# Patient Record
Sex: Female | Born: 1945 | Race: White | Hispanic: No | Marital: Married | Smoking: Current every day smoker
Health system: Southern US, Community
[De-identification: ages and names within clinical notes are randomized; demographics above are authoritative.]

## PROBLEM LIST (undated history)

## (undated) DIAGNOSIS — Z923 Personal history of irradiation: Secondary | ICD-10-CM

## (undated) DIAGNOSIS — E7800 Pure hypercholesterolemia, unspecified: Secondary | ICD-10-CM

## (undated) DIAGNOSIS — F32A Depression, unspecified: Secondary | ICD-10-CM

## (undated) DIAGNOSIS — I8392 Asymptomatic varicose veins of left lower extremity: Secondary | ICD-10-CM

## (undated) DIAGNOSIS — R011 Cardiac murmur, unspecified: Secondary | ICD-10-CM

## (undated) DIAGNOSIS — R413 Other amnesia: Secondary | ICD-10-CM

## (undated) DIAGNOSIS — R7989 Other specified abnormal findings of blood chemistry: Secondary | ICD-10-CM

## (undated) DIAGNOSIS — D0510 Intraductal carcinoma in situ of unspecified breast: Secondary | ICD-10-CM

## (undated) DIAGNOSIS — I10 Essential (primary) hypertension: Secondary | ICD-10-CM

## (undated) DIAGNOSIS — Z8739 Personal history of other diseases of the musculoskeletal system and connective tissue: Secondary | ICD-10-CM

## (undated) DIAGNOSIS — M51369 Other intervertebral disc degeneration, lumbar region without mention of lumbar back pain or lower extremity pain: Secondary | ICD-10-CM

## (undated) DIAGNOSIS — I208 Other forms of angina pectoris: Secondary | ICD-10-CM

## (undated) DIAGNOSIS — C801 Malignant (primary) neoplasm, unspecified: Secondary | ICD-10-CM

## (undated) DIAGNOSIS — M1990 Unspecified osteoarthritis, unspecified site: Secondary | ICD-10-CM

## (undated) DIAGNOSIS — M5136 Other intervertebral disc degeneration, lumbar region: Secondary | ICD-10-CM

## (undated) DIAGNOSIS — I2089 Other forms of angina pectoris: Secondary | ICD-10-CM

## (undated) HISTORY — PX: HERNIA REPAIR: SHX51

## (undated) HISTORY — PX: HEMORRHOIDECTOMY WITH HEMORRHOID BANDING: SHX5633

## (undated) HISTORY — DX: Cardiac murmur, unspecified: R01.1

## (undated) HISTORY — DX: Malignant (primary) neoplasm, unspecified: C80.1

## (undated) HISTORY — DX: Other intervertebral disc degeneration, lumbar region: M51.36

## (undated) HISTORY — PX: APPENDECTOMY: SHX54

## (undated) HISTORY — DX: Other intervertebral disc degeneration, lumbar region without mention of lumbar back pain or lower extremity pain: M51.369

## (undated) HISTORY — DX: Other forms of angina pectoris: I20.8

## (undated) HISTORY — DX: Other forms of angina pectoris: I20.89

## (undated) HISTORY — DX: Essential (primary) hypertension: I10

## (undated) HISTORY — DX: Unspecified osteoarthritis, unspecified site: M19.90

---

## 1975-12-13 DIAGNOSIS — C801 Malignant (primary) neoplasm, unspecified: Secondary | ICD-10-CM

## 1975-12-13 HISTORY — DX: Malignant (primary) neoplasm, unspecified: C80.1

## 1975-12-13 HISTORY — PX: ABDOMINAL HYSTERECTOMY: SHX81

## 2000-12-12 HISTORY — PX: COLONOSCOPY: SHX174

## 2005-02-17 ENCOUNTER — Ambulatory Visit: Payer: Self-pay

## 2006-02-09 ENCOUNTER — Ambulatory Visit: Payer: Self-pay | Admitting: Internal Medicine

## 2007-02-10 ENCOUNTER — Ambulatory Visit: Payer: Self-pay | Admitting: Internal Medicine

## 2007-06-25 ENCOUNTER — Ambulatory Visit: Payer: Self-pay | Admitting: Physician Assistant

## 2007-10-22 ENCOUNTER — Ambulatory Visit: Payer: Self-pay | Admitting: Gastroenterology

## 2007-10-29 ENCOUNTER — Ambulatory Visit: Payer: Self-pay | Admitting: Gastroenterology

## 2007-12-19 ENCOUNTER — Ambulatory Visit: Payer: Self-pay | Admitting: General Surgery

## 2008-12-12 DIAGNOSIS — M1990 Unspecified osteoarthritis, unspecified site: Secondary | ICD-10-CM

## 2008-12-12 HISTORY — DX: Unspecified osteoarthritis, unspecified site: M19.90

## 2012-12-12 DIAGNOSIS — I10 Essential (primary) hypertension: Secondary | ICD-10-CM

## 2012-12-12 HISTORY — PX: BREAST BIOPSY: SHX20

## 2012-12-12 HISTORY — DX: Essential (primary) hypertension: I10

## 2013-01-31 ENCOUNTER — Ambulatory Visit: Payer: Self-pay | Admitting: Family

## 2013-02-25 ENCOUNTER — Ambulatory Visit: Payer: Self-pay | Admitting: General Surgery

## 2013-02-26 ENCOUNTER — Telehealth: Payer: Self-pay | Admitting: General Surgery

## 2013-02-26 LAB — PATHOLOGY REPORT

## 2013-02-26 NOTE — Telephone Encounter (Signed)
The patient breast biopsy was benign. The pathologist reported microcalcifications of fibrocystic changes. The patient reports that she has had minimal discomfort well controlled with Tylenol. The patient is aware that she needs to present next week for wound evaluation with the nurse and will be contacted in 6 months for repeat right breast mammogram.

## 2013-02-28 ENCOUNTER — Encounter: Payer: Self-pay | Admitting: *Deleted

## 2013-03-04 ENCOUNTER — Ambulatory Visit (INDEPENDENT_AMBULATORY_CARE_PROVIDER_SITE_OTHER): Payer: Medicare Other | Admitting: *Deleted

## 2013-03-04 DIAGNOSIS — R920 Mammographic microcalcification found on diagnostic imaging of breast: Secondary | ICD-10-CM

## 2013-03-04 NOTE — Progress Notes (Signed)
March 04, 2013 @ 1025 This pt is seen this morning for wound check, one week follow up right breast stereo biopsy. Pt has no complaints. Right breast biopsy site with steri strips intacy. No bruising, redness,swelling or drainage noted. Pt is to follow up in 6 months with a right breast mammogram and office visit to follow. Pt advised that she would receive a letter from our office at that time with date/time of both appointments. Pt advised to call our office if any problems before that time. Pt verbalized her understanding. L.Alonna Buckler RN

## 2013-03-26 ENCOUNTER — Ambulatory Visit: Payer: Self-pay | Admitting: Family

## 2013-08-29 ENCOUNTER — Ambulatory Visit: Payer: Self-pay | Admitting: General Surgery

## 2013-09-02 ENCOUNTER — Encounter: Payer: Self-pay | Admitting: General Surgery

## 2013-09-10 ENCOUNTER — Ambulatory Visit: Payer: Medicare Other | Admitting: General Surgery

## 2013-10-09 ENCOUNTER — Encounter: Payer: Self-pay | Admitting: *Deleted

## 2014-10-13 ENCOUNTER — Encounter: Payer: Self-pay | Admitting: *Deleted

## 2015-02-12 DIAGNOSIS — E784 Other hyperlipidemia: Secondary | ICD-10-CM | POA: Diagnosis not present

## 2015-02-12 DIAGNOSIS — I10 Essential (primary) hypertension: Secondary | ICD-10-CM | POA: Diagnosis not present

## 2015-02-12 DIAGNOSIS — R5381 Other malaise: Secondary | ICD-10-CM | POA: Diagnosis not present

## 2015-04-03 NOTE — Op Note (Signed)
PATIENT NAME:  Deanna Grant, Deanna Grant MR#:  [id number] DATE OF BIRTH:  [birth year]  DATE OF PROCEDURE:  02/25/2013  PREOPERATIVE DIAGNOSIS: Right inguinal hernia.   POSTOPERATIVE DIAGNOSIS: Right inguinal hernia.   OPERATIVE PROCEDURE: Right inguinal hernia repair with Ultrapro mesh.   SURGEON: Hervey Ard, MD  ANESTHESIA: General by LMA under Dr. Carolin Sicks, Marcaine 0.5% with 1:200,000 units of epinephrine, 30 mL local infiltration, Toradol 30 mg.   ESTIMATED BLOOD LOSS: Minimal.   CLINICAL NOTE: This 68 year old woman has developed a symptomatic right inguinal hernia. She was admitted for elective repair.   DESCRIPTION OF PROCEDURE: With the patient under adequate general anesthesia, the lower abdomen and proximal thigh were prepped with ChloraPrep and draped. A 5 cm skin line incision was made after infiltration with local anesthetic for postoperative analgesia. Skin and subcutaneous tissue was divided sharply. Hemostasis was with 3-0 Vicryl ties and electrocautery. The external oblique was opened in the direction of its fibers. There was a large inguinal hernia defect. The round ligament was excised and discarded. The preperitoneal space was cleared. The femoral canal was cleared. A large Ultrapro mesh was smoothed into position. The external component was laid along the floor of the inguinal canal. This was anchored to the pubic tubercle with 0 Surgilon. The inferior border was anchored to the inguinal ligament with interrupted 0 Surgilon sutures and the medial and superior borders anchored to the transverse abdominis aponeurosis. The ilioinguinal and iliohypogastric nerves were identified and protected. The external oblique was closed with a running 2-0 Vicryl after instillation of 30 mg of Toradol. Scarpa's fascia was closed with a running 3-0 Vicryl and the skin closed with a        running 4-0 Vicryl subcuticular suture. Benzoin, Steri-Strips, Telfa and Tegaderm dressing was then applied.    The patient tolerated the procedure well and was taken to the recovery room in stable condition.  ____________________________ Robert Bellow, MD jwb:sb D: 02/25/2013 10:54:33 ET T: 02/25/2013 11:28:17 ET JOB#: [id number]  cc: Robert Bellow, MD, <Dictator> Gracy Bruins. Jackelyn Hoehn, NP

## 2015-06-26 DIAGNOSIS — G4762 Sleep related leg cramps: Secondary | ICD-10-CM | POA: Diagnosis not present

## 2015-06-26 DIAGNOSIS — Z87891 Personal history of nicotine dependence: Secondary | ICD-10-CM | POA: Diagnosis not present

## 2015-06-26 DIAGNOSIS — I10 Essential (primary) hypertension: Secondary | ICD-10-CM | POA: Diagnosis not present

## 2016-09-13 DIAGNOSIS — E8881 Metabolic syndrome: Secondary | ICD-10-CM | POA: Diagnosis not present

## 2016-09-13 DIAGNOSIS — E784 Other hyperlipidemia: Secondary | ICD-10-CM | POA: Diagnosis not present

## 2016-09-13 DIAGNOSIS — R5381 Other malaise: Secondary | ICD-10-CM | POA: Diagnosis not present

## 2016-09-13 DIAGNOSIS — G4762 Sleep related leg cramps: Secondary | ICD-10-CM | POA: Diagnosis not present

## 2016-09-13 DIAGNOSIS — I10 Essential (primary) hypertension: Secondary | ICD-10-CM | POA: Diagnosis not present

## 2016-09-21 DIAGNOSIS — E8881 Metabolic syndrome: Secondary | ICD-10-CM | POA: Diagnosis not present

## 2016-09-21 DIAGNOSIS — E784 Other hyperlipidemia: Secondary | ICD-10-CM | POA: Diagnosis not present

## 2016-09-21 DIAGNOSIS — Z87891 Personal history of nicotine dependence: Secondary | ICD-10-CM | POA: Diagnosis not present

## 2016-09-21 DIAGNOSIS — T360X5S Adverse effect of penicillins, sequela: Secondary | ICD-10-CM | POA: Diagnosis not present

## 2017-05-12 DIAGNOSIS — I10 Essential (primary) hypertension: Secondary | ICD-10-CM | POA: Diagnosis not present

## 2017-05-12 DIAGNOSIS — R5381 Other malaise: Secondary | ICD-10-CM | POA: Diagnosis not present

## 2017-05-12 DIAGNOSIS — R6889 Other general symptoms and signs: Secondary | ICD-10-CM | POA: Diagnosis not present

## 2017-05-12 DIAGNOSIS — Z1231 Encounter for screening mammogram for malignant neoplasm of breast: Secondary | ICD-10-CM | POA: Diagnosis not present

## 2017-05-12 DIAGNOSIS — Z1211 Encounter for screening for malignant neoplasm of colon: Secondary | ICD-10-CM | POA: Diagnosis not present

## 2017-05-12 DIAGNOSIS — M25512 Pain in left shoulder: Secondary | ICD-10-CM | POA: Diagnosis not present

## 2017-05-12 DIAGNOSIS — Z720 Tobacco use: Secondary | ICD-10-CM | POA: Diagnosis not present

## 2017-05-12 DIAGNOSIS — R5383 Other fatigue: Secondary | ICD-10-CM | POA: Diagnosis not present

## 2017-05-12 DIAGNOSIS — R7989 Other specified abnormal findings of blood chemistry: Secondary | ICD-10-CM | POA: Insufficient documentation

## 2017-05-12 DIAGNOSIS — M542 Cervicalgia: Secondary | ICD-10-CM | POA: Diagnosis not present

## 2017-05-12 DIAGNOSIS — G8929 Other chronic pain: Secondary | ICD-10-CM | POA: Diagnosis not present

## 2017-05-12 DIAGNOSIS — Z7689 Persons encountering health services in other specified circumstances: Secondary | ICD-10-CM | POA: Diagnosis not present

## 2017-07-07 DIAGNOSIS — K5909 Other constipation: Secondary | ICD-10-CM | POA: Diagnosis not present

## 2017-07-07 DIAGNOSIS — K219 Gastro-esophageal reflux disease without esophagitis: Secondary | ICD-10-CM | POA: Diagnosis not present

## 2017-07-07 DIAGNOSIS — Z1211 Encounter for screening for malignant neoplasm of colon: Secondary | ICD-10-CM | POA: Diagnosis not present

## 2017-09-29 ENCOUNTER — Emergency Department
Admission: EM | Admit: 2017-09-29 | Discharge: 2017-09-29 | Disposition: A | Discharge status: Home / Self Care | Payer: Medicare Other | Attending: Emergency Medicine | Admitting: Emergency Medicine

## 2017-09-29 ENCOUNTER — Emergency Department: Payer: Medicare Other

## 2017-09-29 ENCOUNTER — Encounter: Payer: Self-pay | Admitting: Emergency Medicine

## 2017-09-29 DIAGNOSIS — M5116 Intervertebral disc disorders with radiculopathy, lumbar region: Secondary | ICD-10-CM | POA: Diagnosis not present

## 2017-09-29 DIAGNOSIS — Y939 Activity, unspecified: Secondary | ICD-10-CM | POA: Diagnosis not present

## 2017-09-29 DIAGNOSIS — Y999 Unspecified external cause status: Secondary | ICD-10-CM | POA: Insufficient documentation

## 2017-09-29 DIAGNOSIS — F17200 Nicotine dependence, unspecified, uncomplicated: Secondary | ICD-10-CM | POA: Diagnosis not present

## 2017-09-29 DIAGNOSIS — Z79899 Other long term (current) drug therapy: Secondary | ICD-10-CM | POA: Insufficient documentation

## 2017-09-29 DIAGNOSIS — M5136 Other intervertebral disc degeneration, lumbar region: Secondary | ICD-10-CM

## 2017-09-29 DIAGNOSIS — I10 Essential (primary) hypertension: Secondary | ICD-10-CM | POA: Diagnosis not present

## 2017-09-29 DIAGNOSIS — X58XXXA Exposure to other specified factors, initial encounter: Secondary | ICD-10-CM | POA: Diagnosis not present

## 2017-09-29 DIAGNOSIS — S39012A Strain of muscle, fascia and tendon of lower back, initial encounter: Secondary | ICD-10-CM | POA: Insufficient documentation

## 2017-09-29 DIAGNOSIS — M62830 Muscle spasm of back: Secondary | ICD-10-CM | POA: Diagnosis not present

## 2017-09-29 DIAGNOSIS — Y929 Unspecified place or not applicable: Secondary | ICD-10-CM | POA: Diagnosis not present

## 2017-09-29 DIAGNOSIS — M545 Low back pain: Secondary | ICD-10-CM | POA: Diagnosis not present

## 2017-09-29 DIAGNOSIS — S3992XA Unspecified injury of lower back, initial encounter: Secondary | ICD-10-CM | POA: Diagnosis present

## 2017-09-29 LAB — URINALYSIS, COMPLETE (UACMP) WITH MICROSCOPIC
Bilirubin Urine: NEGATIVE
GLUCOSE, UA: NEGATIVE mg/dL
Hgb urine dipstick: NEGATIVE
KETONES UR: NEGATIVE mg/dL
LEUKOCYTES UA: NEGATIVE
NITRITE: NEGATIVE
PH: 7 (ref 5.0–8.0)
Protein, ur: NEGATIVE mg/dL
SPECIFIC GRAVITY, URINE: 1.009 (ref 1.005–1.030)

## 2017-09-29 MED ORDER — ORPHENADRINE CITRATE 30 MG/ML IJ SOLN
60.0000 mg | INTRAMUSCULAR | Status: AC
  Administered 2017-09-29: 60 mg via INTRAMUSCULAR
  Filled 2017-09-29: qty 2

## 2017-09-29 MED ORDER — KETOROLAC TROMETHAMINE 30 MG/ML IJ SOLN
30.0000 mg | Freq: Once | INTRAMUSCULAR | Status: AC
  Administered 2017-09-29: 30 mg via INTRAMUSCULAR
  Filled 2017-09-29: qty 1

## 2017-09-29 MED ORDER — DICLOFENAC SODIUM 50 MG PO TBEC: 50.0000 mg | DELAYED_RELEASE_TABLET | Freq: Two times a day (BID) | ORAL | 1 refills | Status: DC

## 2017-09-29 MED ORDER — CYCLOBENZAPRINE HCL 5 MG PO TABS: 5.0000 mg | ORAL_TABLET | Freq: Three times a day (TID) | ORAL | 0 refills | Status: AC | PRN

## 2017-09-29 NOTE — ED Provider Notes (Signed)
[facility] Emergency Department Provider Note ____________________________________________  Time seen: 1405  I have reviewed the triage vital signs and the nursing notes.  HISTORY  Chief Complaint  Back Pain  HPI Deanna Grant is a 71 y.o. female Presents to the ED, and by her adult daughter, for evaluation of intermittent right-sided low back pain for the last 3 weeks. Patient denies any injury, accident, trauma, or fall. She also denies any distal paresthesias, foot drop, or incontinence. She localizes pain to the right flank and reports the pain is intermittent with spasms. She also reports pain is aggravated by transitioning from sit to stand as well as turning over in the bed. He denies any bladder or bowel changes, dysuria, urinary retention, or fevers. She has a history of underlying arthritis, hypertension, and appendectomy.  Past Medical History:  Diagnosis Date  . Angina at rest [facility])   . Arthritis 2010  . Cancer ([place]) 1977   cervical  . Heart murmur   . Hypertension 2014    Patient Active Problem List   Diagnosis Date Noted  . Hypertension     Past Surgical History:  Procedure Laterality Date  . ABDOMINAL HYSTERECTOMY  1977  . BREAST BIOPSY Right 2014   stereo  . COLONOSCOPY  2002    Prior to Admission medications   Medication Sig Start Date End Date Taking? Authorizing Provider  simvastatin (ZOCOR) 20 MG tablet Take 20 mg by mouth daily.   Yes [provider]  ALPRAZolam (XANAX) 0.25 MG tablet Take 0.25 mg by mouth at bedtime as needed for sleep.    [provider]  atorvastatin (LIPITOR) 20 MG tablet Take 20 mg by mouth daily.    [provider]  naproxen (NAPROSYN) 500 MG tablet Take 500 mg by mouth 2 (two) times daily with a meal.    [provider]  omeprazole (PRILOSEC) 20 MG capsule Take 20 mg by mouth daily.    [provider]  triamterene-hydrochlorothiazide (DYAZIDE) 37.5-25 MG per  capsule Take 1 capsule by mouth daily.    [provider]   Allergies Asa [aspirin] and Penicillins  Family History  Problem Relation Age of Onset  . Colon cancer Father   . Pancreatic cancer Brother   . Lung cancer Cousin     Social History Social History  Substance Use Topics  . Smoking status: Current Every Day Smoker  . Smokeless tobacco: Never Used  . Alcohol use No    Review of Systems  Constitutional: Negative for fever. Cardiovascular: Negative for chest pain. Respiratory: Negative for shortness of breath. Gastrointestinal: Negative for abdominal pain, vomiting and diarrhea. Genitourinary: Negative for dysuria. Musculoskeletal: positive for back pain. Skin: Negative for rash. Neurological: Negative for headaches, focal weakness or numbness. ____________________________________________  PHYSICAL EXAM:  VITAL SIGNS: ED Triage Vitals  Enc Vitals Group     BP 09/29/17 1249 (!) 163/82     Pulse Rate 09/29/17 1249 69     Resp 09/29/17 1249 16     Temp 09/29/17 1249 98.1 F (36.7 C)     Temp Source 09/29/17 1249 Oral     SpO2 09/29/17 1249 100 %     Weight 09/29/17 1250 162 lb (73.5 kg)     Height --      Head Circumference --      Peak Flow --      Pain Score 09/29/17 1246 10     Pain Loc --      Pain  Edu? --      Excl. in [place]? --     Constitutional: Alert and oriented. Well appearing and in no distress. Head: Normocephalic and atraumatic. Cardiovascular: Normal rate, regular rhythm. Normal distal pulses. Respiratory: Normal respiratory effort. No wheezes/rales/rhonchi. Gastrointestinal: Soft and nontender. No distention. Musculoskeletal: normal spinal alignment without midline tenderness, spasm, deformity, or step-off. Patient with tenderness palpation over the right flank on palpation. No palpable muscle spasm is noted. Patient transitioned from sitting to standing without difficulty. Nontender with normal range of motion in all extremities.   Neurologic: CN II-XII grossly intact. Normal LE DTRs bilaterally. Normal gait without ataxia. Normal speech and language. No gross focal neurologic deficits are appreciated. Skin:  Skin is warm, dry and intact. No rash noted. ____________________________________________   LABS (pertinent positives/negatives)  Labs Reviewed  URINALYSIS, COMPLETE (UACMP) WITH MICROSCOPIC - Abnormal; Notable for the following:       Result Value   Color, Urine YELLOW (*)    APPearance CLEAR (*)    Bacteria, UA RARE (*)    Squamous Epithelial / LPF 0-5 (*)    All other components within normal limits  ___________________________________________   RADIOLOGY  Lumbar Spine  Findings:  There is grade 1 anterolisthesis of L4 on L5 secondary to facet disease. There is no spondylolysis.  There is no acute fracture.  There is mild degenerative disc disease with disc height loss at L1-2 and L2-3. Bilateral facet arthropathy at L4-5 and L5-S1. ____________________________________________  PROCEDURES  Toradol 30 mg IM Norflex 60 mg IM ____________________________________________  INITIAL IMPRESSION / ASSESSMENT AND PLAN / ED COURSE  Patient with ED evaluation of a 3 weeks of intermittent right-sided low back pain. Patient's exam is overall benign. She has reproducible, palpable pain to the right paraspinal musculature. Her urinalysis is negative for any acute cystitis. Her x-rays for any acute findings. Her symptoms be more consistent with a muscle strain despite her underlying Arthritis and DDD. She is discharged with a prescription for Flexeril and Voltaren to dose as directed. She will follow with primary care provider for ongoing symptom management. Return to ED as needed. ____________________________________________  FINAL CLINICAL IMPRESSION(S) / ED DIAGNOSES  Final diagnoses:  Strain of lumbar region, initial encounter  Muscle spasm of back  DDD (degenerative disc disease), lumbar       Phebe Dettmer, Dannielle Karvonen, PA-C 09/29/17 1603    Schuyler Amor, MD 09/29/17 2112

## 2017-09-29 NOTE — Discharge Instructions (Signed)
Your exam, urine, and x-rays are consistent with a lumbar muscle strain. Take the prescription meds as directed. Follow-up with your provider or ongoing symptom management. Return to the ED as needed.

## 2017-09-29 NOTE — ED Triage Notes (Signed)
Pt to ed with c/o back pain intermittent x 3 weeks.  Pt states much more severe this am.  Reports worse with movement and activity.

## 2017-11-13 ENCOUNTER — Other Ambulatory Visit: Payer: Self-pay | Admitting: Internal Medicine

## 2017-11-13 ENCOUNTER — Encounter: Payer: Self-pay | Admitting: *Deleted

## 2017-11-13 DIAGNOSIS — Z1231 Encounter for screening mammogram for malignant neoplasm of breast: Secondary | ICD-10-CM

## 2017-11-22 ENCOUNTER — Encounter: Payer: Self-pay | Admitting: General Surgery

## 2017-11-22 ENCOUNTER — Ambulatory Visit (INDEPENDENT_AMBULATORY_CARE_PROVIDER_SITE_OTHER): Payer: Medicare Other | Admitting: General Surgery

## 2017-11-22 VITALS — BP 130/82 | HR 80 | Resp 12 | Ht <= 58 in | Wt 166.0 lb

## 2017-11-22 DIAGNOSIS — D213 Benign neoplasm of connective and other soft tissue of thorax: Secondary | ICD-10-CM | POA: Diagnosis not present

## 2017-11-22 DIAGNOSIS — R222 Localized swelling, mass and lump, trunk: Secondary | ICD-10-CM

## 2017-11-22 DIAGNOSIS — D045 Carcinoma in situ of skin of trunk: Secondary | ICD-10-CM | POA: Diagnosis not present

## 2017-11-22 NOTE — Progress Notes (Signed)
Patient ID: Deanna Grant, female   DOB: 1946/11/30, 72 y.o.   MRN: [id number]  Chief Complaint  Patient presents with  . Mass    HPI Deanna Grant is a 71 y.o. female. Here for evaluation of a chest mass mid chest. She states she first noticed the area back in August. She states it started as a mole then got larger. The area is tender "throbbing". She states it itches. She is here with her husband, Deanna Grant of 64 years.  HPI  Past Medical History:  Diagnosis Date  . Angina at rest [facility])   . Arthritis 2010  . Cancer ([place]) 1977   cervical  . Degenerative disc disease, lumbar   . Heart murmur   . Hypertension 2014    Past Surgical History:  Procedure Laterality Date  . ABDOMINAL HYSTERECTOMY  1977  . BREAST BIOPSY Right 2014   stereo  . COLONOSCOPY  2002    Family History  Problem Relation Age of Onset  . Colon cancer Father   . Pancreatic cancer Brother   . Lung cancer Cousin     Social History Social History   Tobacco Use  . Smoking status: Current Every Day Smoker    Packs/day: 0.50    Years: 30.00    Pack years: 15.00  . Smokeless tobacco: Never Used  Substance Use Topics  . Alcohol use: No  . Drug use: No    Allergies  Allergen Reactions  . Asa [Aspirin] Nausea Only  . Penicillins Hives    Current Outpatient Medications  Medication Sig Dispense Refill  . Acetaminophen (ARTHRITIS PAIN PO) Take by mouth daily.    Marland Kitchen ALPRAZolam (XANAX) 0.25 MG tablet Take 0.25 mg by mouth at bedtime as needed for sleep.    . Cholecalciferol (VITAMIN D3) 2000 units TABS Take by mouth daily.    . cyclobenzaprine (FLEXERIL) 5 MG tablet Take 1 tablet (5 mg total) by mouth 3 (three) times daily as needed for muscle spasms. 15 tablet 0  . diclofenac (VOLTAREN) 50 MG EC tablet Take 1 tablet (50 mg total) by mouth 2 (two) times daily. 30 tablet 1  . simvastatin (ZOCOR) 20 MG tablet Take 20 mg by mouth daily.    Marland Kitchen triamterene-hydrochlorothiazide (DYAZIDE) 37.5-25 MG per  capsule Take 1 capsule by mouth daily.     No current facility-administered medications for this visit.     Review of Systems Review of Systems  Constitutional: Negative.   Respiratory: Negative.   Cardiovascular: Negative.     Blood pressure 130/82, pulse 80, resp. rate 12, height 4\' 9"  (1.448 m), weight 166 lb (75.3 kg).  Physical Exam Physical Exam  Constitutional: She is oriented to person, place, and time. She appears well-developed and well-nourished.  Pulmonary/Chest:    Neurological: She is alert and oriented to person, place, and time.  Skin: Skin is warm and dry.  Psychiatric: Her behavior is normal.       Assessment    Inflamed chest wall nodule.    Plan    Options for management reviewed. Patient is amenable to excision. 20 mL of 0.5% Xylocaine with 0.25% Marcaine with 1-200,000 units epinephrine was utilized well tolerated. ChloraPrep was applied to the skin. The area was excised through elliptical incision. The specimen was sent for routine histology. Scant bleeding was noted. The skin defect was closed with interrupted4-0 Prolene sutures. Telfa and Tegaderm dressing applied.  Wound care reviewed.    Return for suture removal Will call with  pathology   HPI, Physical Exam, Assessment and Plan have been scribed under the direction and in the presence of Robert Bellow, MD. Karie Fetch, RN  I have completed the exam and reviewed the above documentation for accuracy and completeness.  I agree with the above.  Haematologist has been used and any errors in dictation or transcription are unintentional.  Hervey Ard, M.D., F.A.C.S. Robert Bellow 11/22/2017, 9:19 PM

## 2017-11-22 NOTE — Patient Instructions (Addendum)
The patient is aware to call back for any questions or concerns. May shower May remove dressing in 2-3 days Return for suture removal May use an Ice pack as needed for comfort

## 2017-11-27 ENCOUNTER — Telehealth: Payer: Self-pay | Admitting: General Surgery

## 2017-11-27 NOTE — Telephone Encounter (Signed)
I left a message on her cell phone that the lesion had been completely removed. Attempts to call her home number with no answer.  We'll have the office staff contact her tomorrow to let her know that this was a small skin cancer completely removed and arrange for a follow-up examination in one month.

## 2017-11-28 ENCOUNTER — Telehealth: Payer: Self-pay | Admitting: *Deleted

## 2017-11-28 NOTE — Telephone Encounter (Signed)
Called patient to give results, her husband answered the phone stating that the patient was not at home right now but he will have her give [place] a call when she gets in.

## 2017-11-28 NOTE — Telephone Encounter (Signed)
Notified patient as instructed, patient pleased. Discussed follow-up appointments, patient agrees. She will make follow up appointment at next visit.

## 2017-11-29 ENCOUNTER — Ambulatory Visit (INDEPENDENT_AMBULATORY_CARE_PROVIDER_SITE_OTHER): Payer: Medicare Other

## 2017-11-29 DIAGNOSIS — R222 Localized swelling, mass and lump, trunk: Secondary | ICD-10-CM

## 2017-11-29 NOTE — Progress Notes (Signed)
Patient ID: Deanna Grant, female   DOB: 12-Oct-1946, 71 y.o.   MRN: [id number] Patient came in today for a wound check.  The wound is clean, with no signs of infection noted, sutures removed. Follow up as scheduled.

## 2017-12-08 ENCOUNTER — Ambulatory Visit
Admission: RE | Admit: 2017-12-08 | Discharge: 2017-12-08 | Disposition: A | Discharge status: Home / Self Care | Payer: Medicare Other | Source: Ambulatory Visit | Attending: Internal Medicine | Admitting: Internal Medicine

## 2017-12-08 DIAGNOSIS — Z1231 Encounter for screening mammogram for malignant neoplasm of breast: Secondary | ICD-10-CM | POA: Diagnosis not present

## 2017-12-14 ENCOUNTER — Encounter
Admission: RE | Disposition: A | Discharge status: Home / Self Care | Payer: Self-pay | Source: Ambulatory Visit | Attending: Gastroenterology

## 2017-12-14 ENCOUNTER — Ambulatory Visit: Payer: Medicare Other | Admitting: Anesthesiology

## 2017-12-14 ENCOUNTER — Encounter: Payer: Self-pay | Admitting: *Deleted

## 2017-12-14 ENCOUNTER — Other Ambulatory Visit: Payer: Self-pay

## 2017-12-14 ENCOUNTER — Ambulatory Visit
Admission: RE | Admit: 2017-12-14 | Discharge: 2017-12-14 | Disposition: A | Discharge status: Home / Self Care | Payer: Medicare Other | Source: Ambulatory Visit | Attending: Gastroenterology | Admitting: Gastroenterology

## 2017-12-14 DIAGNOSIS — Z880 Allergy status to penicillin: Secondary | ICD-10-CM | POA: Insufficient documentation

## 2017-12-14 DIAGNOSIS — Z538 Procedure and treatment not carried out for other reasons: Secondary | ICD-10-CM | POA: Insufficient documentation

## 2017-12-14 DIAGNOSIS — I10 Essential (primary) hypertension: Secondary | ICD-10-CM | POA: Insufficient documentation

## 2017-12-14 DIAGNOSIS — K219 Gastro-esophageal reflux disease without esophagitis: Secondary | ICD-10-CM | POA: Insufficient documentation

## 2017-12-14 DIAGNOSIS — Z1211 Encounter for screening for malignant neoplasm of colon: Secondary | ICD-10-CM | POA: Insufficient documentation

## 2017-12-14 DIAGNOSIS — K5909 Other constipation: Secondary | ICD-10-CM | POA: Insufficient documentation

## 2017-12-14 DIAGNOSIS — Z886 Allergy status to analgesic agent status: Secondary | ICD-10-CM | POA: Diagnosis not present

## 2017-12-14 DIAGNOSIS — Z8541 Personal history of malignant neoplasm of cervix uteri: Secondary | ICD-10-CM | POA: Diagnosis not present

## 2017-12-14 DIAGNOSIS — R011 Cardiac murmur, unspecified: Secondary | ICD-10-CM | POA: Diagnosis not present

## 2017-12-14 DIAGNOSIS — K641 Second degree hemorrhoids: Secondary | ICD-10-CM | POA: Diagnosis not present

## 2017-12-14 DIAGNOSIS — K5730 Diverticulosis of large intestine without perforation or abscess without bleeding: Secondary | ICD-10-CM | POA: Insufficient documentation

## 2017-12-14 DIAGNOSIS — F17200 Nicotine dependence, unspecified, uncomplicated: Secondary | ICD-10-CM | POA: Insufficient documentation

## 2017-12-14 DIAGNOSIS — M1990 Unspecified osteoarthritis, unspecified site: Secondary | ICD-10-CM | POA: Diagnosis not present

## 2017-12-14 DIAGNOSIS — K621 Rectal polyp: Secondary | ICD-10-CM | POA: Diagnosis not present

## 2017-12-14 DIAGNOSIS — Z79899 Other long term (current) drug therapy: Secondary | ICD-10-CM | POA: Diagnosis not present

## 2017-12-14 HISTORY — PX: COLONOSCOPY WITH PROPOFOL: SHX5780

## 2017-12-14 SURGERY — COLONOSCOPY WITH PROPOFOL
Anesthesia: General

## 2017-12-14 MED ORDER — FENTANYL CITRATE (PF) 100 MCG/2ML IJ SOLN
INTRAMUSCULAR | Status: DC | PRN
  Administered 2017-12-14: 50 ug via INTRAVENOUS

## 2017-12-14 MED ORDER — PROPOFOL 500 MG/50ML IV EMUL
INTRAVENOUS | Status: AC
  Filled 2017-12-14: qty 50

## 2017-12-14 MED ORDER — PROPOFOL 10 MG/ML IV BOLUS
INTRAVENOUS | Status: DC | PRN
  Administered 2017-12-14: 100 mg via INTRAVENOUS

## 2017-12-14 MED ORDER — PROPOFOL 500 MG/50ML IV EMUL
INTRAVENOUS | Status: DC | PRN
  Administered 2017-12-14: 150 ug/kg/min via INTRAVENOUS

## 2017-12-14 MED ORDER — LIDOCAINE 2% (20 MG/ML) 5 ML SYRINGE
INTRAMUSCULAR | Status: DC | PRN
  Administered 2017-12-14: 40 mg via INTRAVENOUS

## 2017-12-14 MED ORDER — SODIUM CHLORIDE 0.9 % IV SOLN
INTRAVENOUS | Status: DC
  Administered 2017-12-14: 11:00:00 via INTRAVENOUS
  Administered 2017-12-14: 1000 mL via INTRAVENOUS

## 2017-12-14 MED ORDER — FENTANYL CITRATE (PF) 100 MCG/2ML IJ SOLN
INTRAMUSCULAR | Status: AC
  Filled 2017-12-14: qty 2

## 2017-12-14 MED ORDER — PHENYLEPHRINE HCL 10 MG/ML IJ SOLN
INTRAMUSCULAR | Status: DC | PRN
  Administered 2017-12-14: 100 ug via INTRAVENOUS

## 2017-12-14 MED ORDER — SODIUM CHLORIDE 0.9 % IV SOLN: INTRAVENOUS | Status: DC

## 2017-12-14 NOTE — Anesthesia Post-op Follow-up Note (Signed)
Anesthesia QCDR form completed.        

## 2017-12-14 NOTE — Transfer of Care (Signed)
Immediate Anesthesia Transfer of Care Note  Patient: Deanna Grant  Procedure(s) Performed: COLONOSCOPY WITH PROPOFOL (N/A )  Patient Location: PACU and Endoscopy Unit  Anesthesia Type:General  Level of Consciousness: sedated  Airway & Oxygen Therapy: Patient Spontanous Breathing and Patient connected to nasal cannula oxygen  Post-op Assessment: Report given to RN and Post -op Vital signs reviewed and stable  Post vital signs: Reviewed and stable  Last Vitals:  Vitals:   12/14/17 1004 12/14/17 1100  BP: 126/66   Pulse: 72 (P) 62  Resp: 18 (P) 18  Temp: (!) 36.1 C (!) (P) 35.9 C  SpO2: 100% (P) 100%    Last Pain:  Vitals:   12/14/17 1100  TempSrc: (P) Tympanic         Complications: No apparent anesthesia complications

## 2017-12-14 NOTE — Anesthesia Preprocedure Evaluation (Signed)
Anesthesia Evaluation  Patient identified by MRN, date of birth, ID band Patient awake    Reviewed: Allergy & Precautions, H&P , NPO status , Patient's Chart, lab work & pertinent test results, reviewed documented beta blocker date and time   History of Anesthesia Complications Negative for: history of anesthetic complications  Airway Mallampati: I  TM Distance: >3 FB Neck ROM: full    Dental  (+) Dental Advidsory Given, Upper Dentures, Missing, Poor Dentition   Pulmonary neg shortness of breath, neg COPD, neg recent URI, Current Smoker,           Cardiovascular Exercise Tolerance: Good hypertension, + angina (stable) with exertion (-) CAD, (-) Past MI, (-) Cardiac Stents and (-) CABG (-) dysrhythmias + Valvular Problems/Murmurs      Neuro/Psych negative neurological ROS  negative psych ROS   GI/Hepatic Neg liver ROS, GERD  ,  Endo/Other  negative endocrine ROS  Renal/GU negative Renal ROS  negative genitourinary   Musculoskeletal   Abdominal   Peds  Hematology negative hematology ROS (+)   Anesthesia Other Findings Past Medical History: No date: Angina at rest [facility]) 2010: Arthritis 1977: Cancer ([place])     Comment:  cervical No date: Degenerative disc disease, lumbar No date: Heart murmur 2014: Hypertension   Reproductive/Obstetrics negative OB ROS                             Anesthesia Physical Anesthesia Plan  ASA: III  Anesthesia Plan: General   Post-op Pain Management:    Induction: Intravenous  PONV Risk Score and Plan: 2 and Propofol infusion  Airway Management Planned: Nasal Cannula  Additional Equipment:   Intra-op Plan:   Post-operative Plan:   Informed Consent: I have reviewed the patients History and Physical, chart, labs and discussed the procedure including the risks, benefits and alternatives for the proposed anesthesia with the patient or authorized  representative who has indicated his/her understanding and acceptance.   Dental Advisory Given  Plan Discussed with: Anesthesiologist, CRNA and Surgeon  Anesthesia Plan Comments:         Anesthesia Quick Evaluation

## 2017-12-14 NOTE — Op Note (Addendum)
[facility] Gastroenterology Patient Name: Deanna Grant Procedure Date: 12/14/2017 10:46 AM MRN: [id number] Account #: [id number] Date of Birth: February 07, 1946 Admit Type: Outpatient Age: 72 Room: [facility] ENDO ROOM 1 Gender: Female Note Status: Addendum Procedure:            Colonoscopy Indications:          Screening for colorectal malignant neoplasm Providers:            Lollie Sails, MD Referring MD:         Tracie Harrier, MD (Referring MD) Complications:        No immediate complications. Procedure:            Pre-Anesthesia Assessment:                       - ASA Grade Assessment: III - A patient with severe                        systemic disease.                       After obtaining informed consent, the colonoscope was                        passed under direct vision. Throughout the procedure,                        the patient's blood pressure, pulse, and oxygen                        saturations were monitored continuously. The                        Colonoscope was introduced through the anus with the                        intention of advancing to the cecum. The scope was                        advanced to the sigmoid colon before the procedure was                        aborted. Medications were given. The colonoscopy was                        unusually difficult due to poor bowel prep with stool                        present. The patient tolerated the procedure well. Findings:      Multiple small and large-mouthed diverticula were found in the sigmoid       colon.      A large amount of semi-liquid solid stool was found in the rectum and in       the sigmoid colon, precluding visualization.      Non-bleeding external and internal hemorrhoids were found during       perianal exam and during anoscopy. The hemorrhoids were medium-sized and       Grade II (internal hemorrhoids that prolapse but reduce spontaneously). Impression:           -  Diverticulosis in the sigmoid colon.                       -  Stool in the rectum and in the sigmoid colon.                       - No specimens collected. Incomplete colonoscopy due to                        poor prep. Recommendation:       - repeat prep, reschedule for tomorrow if possible. Procedure Code(s):    --- Professional ---                       [phone], 45, Colonoscopy, flexible; diagnostic, including                        collection of specimen(s) by brushing or washing, when                        performed (separate procedure) Diagnosis Code(s):    --- Professional ---                       Z12.11, Encounter for screening for malignant neoplasm                        of colon                       K57.30, Diverticulosis of large intestine without                        perforation or abscess without bleeding CPT copyright 2016 American Medical Association. All rights reserved. The codes documented in this report are preliminary and upon coder review may  be revised to meet current compliance requirements. Lollie Sails, MD 12/14/2017 11:09:11 AM This report has been signed electronically. Number of Addenda: 1 Note Initiated On: 12/14/2017 10:46 AM Total Procedure Duration: 0 hours 9 minutes 23 seconds       [facility] Addendum Number: 1   Addendum Date: 12/18/2017 4:53:53 PM      A 2 mm polyp was completely removed from the mid to upper rectum with a       cold forcep. Lollie Sails, MD 12/18/2017 4:54:40 PM This report has been signed electronically.

## 2017-12-14 NOTE — H&P (Signed)
Outpatient short stay form Pre-procedure 12/14/2017 10:42 AM Deanna Sails MD  Primary Physician: Dr. Tracie Harrier  Reason for visit:  Colonoscopy  History of present illness:  Patient is a 72 year old female presenting today for colon cancer screening. Her last colonoscopy was 10/29/2007 with a finding of a sigmoid hyperplastic polyp. She also has diverticulosis. She does have some issues with chronic constipation and has been reticent to take a regular dosing of medication. She was recently recommended to start MiraLAX and we discussed at length today. I do feel she would probably do better in particularly in terms of hemorrhoids etc. if she was more regular. She tolerated her prep well. She takes no aspirin or blood thinning agent.    Current Facility-Administered Medications:  .  0.9 %  sodium chloride infusion, , Intravenous, Continuous, Deanna Sails, MD, Last Rate: 20 mL/hr at 12/14/17 1029, 1,000 mL at 12/14/17 1029 .  0.9 %  sodium chloride infusion, , Intravenous, Continuous, Deanna Sails, MD  Medications Prior to Admission  Medication Sig Dispense Refill Last Dose  . Acetaminophen (ARTHRITIS PAIN PO) Take by mouth daily.   Taking  . Cholecalciferol (VITAMIN D3) 2000 units TABS Take by mouth daily.   Taking  . simvastatin (ZOCOR) 20 MG tablet Take 20 mg by mouth daily.   Past Week at Unknown time  . triamterene-hydrochlorothiazide (DYAZIDE) 37.5-25 MG per capsule Take 1 capsule by mouth daily.   12/14/2017 at Unknown time  . ALPRAZolam (XANAX) 0.25 MG tablet Take 0.25 mg by mouth at bedtime as needed for sleep.   Not Taking at Unknown time  . cyclobenzaprine (FLEXERIL) 5 MG tablet Take 1 tablet (5 mg total) by mouth 3 (three) times daily as needed for muscle spasms. (Patient not taking: Reported on 12/14/2017) 15 tablet 0 Not Taking at Unknown time  . diclofenac (VOLTAREN) 50 MG EC tablet Take 1 tablet (50 mg total) by mouth 2 (two) times daily. (Patient not taking:  Reported on 12/14/2017) 30 tablet 1 Not Taking at Unknown time     Allergies  Allergen Reactions  . Asa [Aspirin] Nausea Only  . Penicillins Hives     Past Medical History:  Diagnosis Date  . Angina at rest [facility])   . Arthritis 2010  . Cancer ([place]) 1977   cervical  . Degenerative disc disease, lumbar   . Heart murmur   . Hypertension 2014    Review of systems:      Physical Exam    Heart and lungs: Regular rate and rhythm without rub or gallop, lungs are bilaterally clear.    HEENT: Normocephalic atraumatic eyes are anicteric    Other:     Pertinant exam for procedure: Soft nontender nondistended bowel sounds positive normoactive. Mild generalized discomfort no masses or rebound.    Planned proceedures: Colonoscopy and indicated procedures. I have discussed the risks benefits and complications of procedures to include not limited to bleeding, infection, perforation and the risk of sedation and the patient wishes to proceed.    Deanna Sails, MD Gastroenterology 12/14/2017  10:42 AM

## 2017-12-15 ENCOUNTER — Encounter
Admission: RE | Disposition: A | Discharge status: Home / Self Care | Payer: Self-pay | Source: Ambulatory Visit | Attending: Gastroenterology

## 2017-12-15 ENCOUNTER — Ambulatory Visit: Payer: Medicare Other | Admitting: Anesthesiology

## 2017-12-15 ENCOUNTER — Ambulatory Visit
Admission: RE | Admit: 2017-12-15 | Discharge: 2017-12-15 | Disposition: A | Discharge status: Home / Self Care | Payer: Medicare Other | Source: Ambulatory Visit | Attending: Gastroenterology | Admitting: Gastroenterology

## 2017-12-15 ENCOUNTER — Encounter: Payer: Self-pay | Admitting: Gastroenterology

## 2017-12-15 DIAGNOSIS — M5136 Other intervertebral disc degeneration, lumbar region: Secondary | ICD-10-CM | POA: Insufficient documentation

## 2017-12-15 DIAGNOSIS — Z800 Family history of malignant neoplasm of digestive organs: Secondary | ICD-10-CM | POA: Insufficient documentation

## 2017-12-15 DIAGNOSIS — Z8541 Personal history of malignant neoplasm of cervix uteri: Secondary | ICD-10-CM | POA: Insufficient documentation

## 2017-12-15 DIAGNOSIS — Z79899 Other long term (current) drug therapy: Secondary | ICD-10-CM | POA: Insufficient documentation

## 2017-12-15 DIAGNOSIS — R011 Cardiac murmur, unspecified: Secondary | ICD-10-CM | POA: Diagnosis not present

## 2017-12-15 DIAGNOSIS — K626 Ulcer of anus and rectum: Secondary | ICD-10-CM | POA: Insufficient documentation

## 2017-12-15 DIAGNOSIS — Z886 Allergy status to analgesic agent status: Secondary | ICD-10-CM | POA: Insufficient documentation

## 2017-12-15 DIAGNOSIS — I10 Essential (primary) hypertension: Secondary | ICD-10-CM | POA: Diagnosis not present

## 2017-12-15 DIAGNOSIS — I209 Angina pectoris, unspecified: Secondary | ICD-10-CM | POA: Diagnosis not present

## 2017-12-15 DIAGNOSIS — D127 Benign neoplasm of rectosigmoid junction: Secondary | ICD-10-CM | POA: Diagnosis not present

## 2017-12-15 DIAGNOSIS — K644 Residual hemorrhoidal skin tags: Secondary | ICD-10-CM | POA: Diagnosis not present

## 2017-12-15 DIAGNOSIS — M1990 Unspecified osteoarthritis, unspecified site: Secondary | ICD-10-CM | POA: Diagnosis not present

## 2017-12-15 DIAGNOSIS — K529 Noninfective gastroenteritis and colitis, unspecified: Secondary | ICD-10-CM | POA: Diagnosis not present

## 2017-12-15 DIAGNOSIS — Z880 Allergy status to penicillin: Secondary | ICD-10-CM | POA: Insufficient documentation

## 2017-12-15 DIAGNOSIS — D125 Benign neoplasm of sigmoid colon: Secondary | ICD-10-CM | POA: Insufficient documentation

## 2017-12-15 DIAGNOSIS — K5790 Diverticulosis of intestine, part unspecified, without perforation or abscess without bleeding: Secondary | ICD-10-CM | POA: Diagnosis not present

## 2017-12-15 DIAGNOSIS — Z1211 Encounter for screening for malignant neoplasm of colon: Secondary | ICD-10-CM | POA: Diagnosis present

## 2017-12-15 HISTORY — PX: COLONOSCOPY WITH PROPOFOL: SHX5780

## 2017-12-15 LAB — SURGICAL PATHOLOGY

## 2017-12-15 SURGERY — COLONOSCOPY WITH PROPOFOL
Anesthesia: General

## 2017-12-15 MED ORDER — LIDOCAINE HCL (PF) 2 % IJ SOLN
INTRAMUSCULAR | Status: AC
  Filled 2017-12-15: qty 10

## 2017-12-15 MED ORDER — PROPOFOL 500 MG/50ML IV EMUL
INTRAVENOUS | Status: AC
  Filled 2017-12-15: qty 50

## 2017-12-15 MED ORDER — SODIUM CHLORIDE 0.9 % IV SOLN: INTRAVENOUS | Status: DC

## 2017-12-15 MED ORDER — PROPOFOL 500 MG/50ML IV EMUL
INTRAVENOUS | Status: DC | PRN
  Administered 2017-12-15: 150 ug/kg/min via INTRAVENOUS

## 2017-12-15 MED ORDER — PROPOFOL 10 MG/ML IV BOLUS
INTRAVENOUS | Status: DC | PRN
  Administered 2017-12-15: 30 mg via INTRAVENOUS
  Administered 2017-12-15: 20 mg via INTRAVENOUS

## 2017-12-15 MED ORDER — LIDOCAINE HCL (CARDIAC) 20 MG/ML IV SOLN
INTRAVENOUS | Status: DC | PRN
  Administered 2017-12-15: 30 mg via INTRAVENOUS

## 2017-12-15 MED ORDER — SODIUM CHLORIDE 0.9 % IV SOLN
INTRAVENOUS | Status: DC
  Administered 2017-12-15: 15:00:00 via INTRAVENOUS

## 2017-12-15 NOTE — Transfer of Care (Signed)
Immediate Anesthesia Transfer of Care Note  Patient: Deanna Grant  Procedure(s) Performed: COLONOSCOPY WITH PROPOFOL (N/A )  Patient Location: PACU  Anesthesia Type:General  Level of Consciousness: sedated  Airway & Oxygen Therapy: Patient Spontanous Breathing and Patient connected to nasal cannula oxygen  Post-op Assessment: Report given to RN and Post -op Vital signs reviewed and stable  Post vital signs: Reviewed and stable  Last Vitals:  Vitals:   12/15/17 1424 12/15/17 1546  BP: (!) 154/60 94/62  Pulse: 68 68  Resp: 16 20  Temp: (!) 36.2 C (!) 35.4 C  SpO2: 97% 98%    Last Pain:  Vitals:   12/15/17 1546  TempSrc: Tympanic         Complications: No apparent anesthesia complications

## 2017-12-15 NOTE — Anesthesia Procedure Notes (Signed)
Date/Time: 12/15/2017 2:59 PM Performed by: Johnna Acosta, CRNA Pre-anesthesia Checklist: Patient identified, Emergency Drugs available, Suction available, Patient being monitored and Timeout performed Patient Re-evaluated:Patient Re-evaluated prior to induction Oxygen Delivery Method: Nasal cannula

## 2017-12-15 NOTE — Anesthesia Postprocedure Evaluation (Signed)
Anesthesia Post Note  Patient: Deanna Grant  Procedure(s) Performed: COLONOSCOPY WITH PROPOFOL (N/A )  Patient location during evaluation: Endoscopy Anesthesia Type: General Level of consciousness: awake and alert Pain management: pain level controlled Vital Signs Assessment: post-procedure vital signs reviewed and stable Respiratory status: spontaneous breathing, nonlabored ventilation, respiratory function stable and patient connected to nasal cannula oxygen Cardiovascular status: blood pressure returned to baseline and stable Postop Assessment: no apparent nausea or vomiting Anesthetic complications: no     Last Vitals:  Vitals:   12/14/17 1130 12/14/17 1140  BP: 113/61 118/72  Pulse: (!) 54 (!) 54  Resp: 15 18  Temp:    SpO2: 100% 100%    Last Pain:  Vitals:   12/14/17 1100  TempSrc: Tympanic                 Martha Clan

## 2017-12-15 NOTE — Op Note (Addendum)
[facility] Gastroenterology Patient Name: Deanna Grant Procedure Date: 12/15/2017 2:57 PM MRN: [id number] Account #: [id number] Date of Birth: [birth year] Admit Type: Outpatient Age: 72 Room: [facility] ENDO ROOM 3 Gender: Female Note Status: Finalized Procedure:            Colonoscopy Indications:          Colon cancer screening in patient at increased risk:                        Colorectal cancer in father Providers:            Lollie Sails, MD Referring MD:         Tracie Harrier, MD (Referring MD) Medicines:            Monitored Anesthesia Care Complications:        No immediate complications. Procedure:            Pre-Anesthesia Assessment:                       - ASA Grade Assessment: III - A patient with severe                        systemic disease.                       After obtaining informed consent, the colonoscope was                        passed under direct vision. Throughout the procedure,                        the patient's blood pressure, pulse, and oxygen                        saturations were monitored continuously. The Olympus                        PCF-H180AL colonoscope ( S#: [id number] ) was introduced                        through the anus and advanced to the the cecum,                        identified by appendiceal orifice and ileocecal valve.                        The colonoscopy was unusually difficult due to                        restricted mobility of the colon and a tortuous colon.                        Successful completion of the procedure was aided by                        changing the patient to a supine position, changing the                        patient to a prone position and using manual pressure.  The patient tolerated the procedure well. The quality                        of the bowel preparation was good. Findings:      A 4 mm polyp was found in the proximal sigmoid colon. The polyp was        sessile. The polyp was removed with a cold snare. Resection and       retrieval were complete.      Six sessile polyps were found in the recto-sigmoid colon. The polyps       were 1 to 2 mm in size. These polyps were removed with a cold biopsy       forceps. Resection and retrieval were complete.      A single localized non-bleeding erosion was found in the mid rectum,       appearance of an enema trauma. Biopsies were taken with a cold forceps       for histology. Impression:           - One 4 mm polyp in the proximal sigmoid colon, removed                        with a cold snare. Resected and retrieved.                       - Six 1 to 2 mm polyps at the recto-sigmoid colon,                        removed with a cold biopsy forceps. Resected and                        retrieved.                       - A single erosion in the mid rectum. Biopsied. Recommendation:       - Discharge patient to home.                       - Advance diet as tolerated.                       - Await pathology results.                       - Use Miralax 1 capful (17 grams) in 8 ounces of water                        PO as directed. Procedure Code(s):    --- Professional ---                       [phone], Colonoscopy, flexible; with removal of tumor(s),                        polyp(s), or other lesion(s) by snare technique                       17510, 49, Colonoscopy, flexible; with biopsy, single                        or multiple Diagnosis Code(s):    --- Professional ---  Z80.0, Family history of malignant neoplasm of                        digestive organs                       D12.5, Benign neoplasm of sigmoid colon                       D12.7, Benign neoplasm of rectosigmoid junction                       K62.6, Ulcer of anus and rectum CPT copyright 2016 American Medical Association. All rights reserved. The codes documented in this report are preliminary and upon coder  review may  be revised to meet current compliance requirements. Lollie Sails, MD 12/15/2017 3:48:21 PM This report has been signed electronically. Number of Addenda: 0 Note Initiated On: 12/15/2017 2:57 PM Scope Withdrawal Time: 0 hours 15 minutes 42 seconds  Total Procedure Duration: 0 hours 35 minutes 19 seconds       [facility]

## 2017-12-15 NOTE — Anesthesia Postprocedure Evaluation (Signed)
Anesthesia Post Note  Patient: Deanna Grant  Procedure(s) Performed: COLONOSCOPY WITH PROPOFOL (N/A )  Patient location during evaluation: Endoscopy Anesthesia Type: General Level of consciousness: awake and alert and oriented Pain management: pain level controlled Vital Signs Assessment: post-procedure vital signs reviewed and stable Respiratory status: spontaneous breathing, nonlabored ventilation and respiratory function stable Cardiovascular status: blood pressure returned to baseline and stable Postop Assessment: no signs of nausea or vomiting Anesthetic complications: no     Last Vitals:  Vitals:   12/15/17 1424 12/15/17 1546  BP: (!) 154/60 94/62  Pulse: 68 68  Resp: 16 20  Temp: (!) 36.2 C (!) 35.4 C  SpO2: 97% 98%    Last Pain:  Vitals:   12/15/17 1546  TempSrc: Tympanic                 Ankush Gintz

## 2017-12-15 NOTE — Anesthesia Preprocedure Evaluation (Signed)
Anesthesia Evaluation  Patient identified by MRN, date of birth, ID band Patient awake    Reviewed: Allergy & Precautions, H&P , NPO status , Patient's Chart, lab work & pertinent test results, reviewed documented beta blocker date and time   History of Anesthesia Complications Negative for: history of anesthetic complications  Airway Mallampati: I  TM Distance: >3 FB Neck ROM: full    Dental  (+) Dental Advidsory Given, Upper Dentures, Missing, Poor Dentition   Pulmonary neg shortness of breath, neg COPD, neg recent URI, Current Smoker,           Cardiovascular Exercise Tolerance: Good hypertension, + angina (stable) with exertion (-) CAD, (-) Past MI, (-) Cardiac Stents and (-) CABG (-) dysrhythmias + Valvular Problems/Murmurs      Neuro/Psych negative neurological ROS  negative psych ROS   GI/Hepatic Neg liver ROS, GERD  ,  Endo/Other  negative endocrine ROS  Renal/GU negative Renal ROS  negative genitourinary   Musculoskeletal   Abdominal   Peds  Hematology negative hematology ROS (+)   Anesthesia Other Findings Past Medical History: No date: Angina at rest [facility]) 2010: Arthritis 1977: Cancer ([place])     Comment:  cervical No date: Degenerative disc disease, lumbar No date: Heart murmur 2014: Hypertension   Reproductive/Obstetrics negative OB ROS                             Anesthesia Physical  Anesthesia Plan  ASA: III  Anesthesia Plan: General   Post-op Pain Management:    Induction: Intravenous  PONV Risk Score and Plan: 2 and Propofol infusion  Airway Management Planned: Nasal Cannula  Additional Equipment:   Intra-op Plan:   Post-operative Plan:   Informed Consent: I have reviewed the patients History and Physical, chart, labs and discussed the procedure including the risks, benefits and alternatives for the proposed anesthesia with the patient or authorized  representative who has indicated his/her understanding and acceptance.   Dental Advisory Given  Plan Discussed with: Anesthesiologist, CRNA and Surgeon  Anesthesia Plan Comments:         Anesthesia Quick Evaluation

## 2017-12-15 NOTE — Anesthesia Post-op Follow-up Note (Signed)
Anesthesia QCDR form completed.        

## 2017-12-15 NOTE — H&P (Signed)
Outpatient short stay form Pre-procedure 12/15/2017 2:37 PM Lollie Sails MD  Primary Physician: Dr Tracie Harrier  Reason for visit:  Colonoscopy  History of present illness:  Patient is a 72 year old female presenting today as above. Her last colonoscopy was 10/29/2007 with a finding of a hyperplastic polyp at that time. She also has a history of diverticulosis. She did percent yesterday for her colonoscopy however the prep was not good and we had to abort the procedure and reprep for today. She did well with that. She takes no aspirin or blood thinning agent. She does have some issues with hemorrhoidal and I discussed that extensively. I do feel that she would do well she took MiraLAX on a regular basis and we will likely need to treat her hemorrhoids medically.    Current Facility-Administered Medications:  .  0.9 %  sodium chloride infusion, , Intravenous, Continuous, Lollie Sails, MD  Medications Prior to Admission  Medication Sig Dispense Refill Last Dose  . ALPRAZolam (XANAX) 0.25 MG tablet Take 0.25 mg by mouth at bedtime as needed for sleep.   Past Week at Unknown time  . diclofenac (VOLTAREN) 50 MG EC tablet Take 1 tablet (50 mg total) by mouth 2 (two) times daily. 30 tablet 1 Past Week at Unknown time  . simvastatin (ZOCOR) 20 MG tablet Take 20 mg by mouth daily.   Past Week at Unknown time  . triamterene-hydrochlorothiazide (DYAZIDE) 37.5-25 MG per capsule Take 1 capsule by mouth daily.   12/15/2017 at Unknown time  . Acetaminophen (ARTHRITIS PAIN PO) Take by mouth daily.   Taking  . Cholecalciferol (VITAMIN D3) 2000 units TABS Take by mouth daily.   Not Taking at Unknown time  . cyclobenzaprine (FLEXERIL) 5 MG tablet Take 1 tablet (5 mg total) by mouth 3 (three) times daily as needed for muscle spasms. (Patient not taking: Reported on 12/14/2017) 15 tablet 0 Not Taking at Unknown time     Allergies  Allergen Reactions  . Asa [Aspirin] Nausea Only  . Penicillins Hives      Past Medical History:  Diagnosis Date  . Angina at rest [facility])   . Arthritis 2010  . Cancer ([place]) 1977   cervical  . Degenerative disc disease, lumbar   . Heart murmur   . Hypertension 2014    Review of systems:      Physical Exam    Heart and lungs: Regular rate and rhythm without rub or gallop, lungs are bilaterally clear.    HEENT: Normocephalic atraumatic eyes are anicteric    Other:     Pertinant exam for procedure: Soft nontender generalized discomfort no masses or rebound. Bowel sounds are positive and normoactive.    Planned proceedures: Colonoscopy and indicated procedures. I have discussed the risks benefits and complications of procedures to include not limited to bleeding, infection, perforation and the risk of sedation and the patient wishes to proceed.    Lollie Sails, MD Gastroenterology 12/15/2017  2:37 PM

## 2017-12-18 ENCOUNTER — Encounter: Payer: Self-pay | Admitting: Gastroenterology

## 2017-12-19 LAB — SURGICAL PATHOLOGY

## 2017-12-28 ENCOUNTER — Ambulatory Visit: Payer: Medicare Other | Admitting: General Surgery

## 2018-02-06 ENCOUNTER — Emergency Department
Admission: EM | Admit: 2018-02-06 | Discharge: 2018-02-06 | Disposition: A | Discharge status: Home / Self Care | Payer: Medicare Other | Attending: Student in an Organized Health Care Education/Training Program | Admitting: Student in an Organized Health Care Education/Training Program

## 2018-02-06 ENCOUNTER — Emergency Department: Payer: Medicare Other

## 2018-02-06 ENCOUNTER — Encounter: Payer: Self-pay | Admitting: Emergency Medicine

## 2018-02-06 ENCOUNTER — Other Ambulatory Visit: Payer: Self-pay

## 2018-02-06 DIAGNOSIS — Z8541 Personal history of malignant neoplasm of cervix uteri: Secondary | ICD-10-CM | POA: Insufficient documentation

## 2018-02-06 DIAGNOSIS — R509 Fever, unspecified: Secondary | ICD-10-CM | POA: Insufficient documentation

## 2018-02-06 DIAGNOSIS — F17210 Nicotine dependence, cigarettes, uncomplicated: Secondary | ICD-10-CM | POA: Insufficient documentation

## 2018-02-06 DIAGNOSIS — M79672 Pain in left foot: Secondary | ICD-10-CM | POA: Diagnosis present

## 2018-02-06 DIAGNOSIS — Z79899 Other long term (current) drug therapy: Secondary | ICD-10-CM | POA: Diagnosis not present

## 2018-02-06 DIAGNOSIS — I10 Essential (primary) hypertension: Secondary | ICD-10-CM | POA: Diagnosis not present

## 2018-02-06 DIAGNOSIS — L03116 Cellulitis of left lower limb: Secondary | ICD-10-CM | POA: Diagnosis not present

## 2018-02-06 LAB — CBC WITH DIFFERENTIAL/PLATELET
BASOS ABS: 0 10*3/uL (ref 0–0.1)
BASOS PCT: 1 %
EOS ABS: 0.1 10*3/uL (ref 0–0.7)
EOS PCT: 2 %
HCT: 41.4 % (ref 35.0–47.0)
Hemoglobin: 14.3 g/dL (ref 12.0–16.0)
Lymphocytes Relative: 24 %
Lymphs Abs: 1.8 10*3/uL (ref 1.0–3.6)
MCH: 32.7 pg (ref 26.0–34.0)
MCHC: 34.6 g/dL (ref 32.0–36.0)
MCV: 94.5 fL (ref 80.0–100.0)
Monocytes Absolute: 0.5 10*3/uL (ref 0.2–0.9)
Monocytes Relative: 7 %
Neutro Abs: 4.9 10*3/uL (ref 1.4–6.5)
Neutrophils Relative %: 66 %
PLATELETS: 179 10*3/uL (ref 150–440)
RBC: 4.38 MIL/uL (ref 3.80–5.20)
RDW: 13.3 % (ref 11.5–14.5)
WBC: 7.4 10*3/uL (ref 3.6–11.0)

## 2018-02-06 LAB — URINALYSIS, COMPLETE (UACMP) WITH MICROSCOPIC
BILIRUBIN URINE: NEGATIVE
GLUCOSE, UA: NEGATIVE mg/dL
Hgb urine dipstick: NEGATIVE
KETONES UR: NEGATIVE mg/dL
NITRITE: NEGATIVE
Protein, ur: NEGATIVE mg/dL
Specific Gravity, Urine: 1.014 (ref 1.005–1.030)
pH: 7 (ref 5.0–8.0)

## 2018-02-06 LAB — COMPREHENSIVE METABOLIC PANEL
ALT: 19 U/L (ref 14–54)
AST: 36 U/L (ref 15–41)
Albumin: 3.9 g/dL (ref 3.5–5.0)
Alkaline Phosphatase: 57 U/L (ref 38–126)
Anion gap: 12 (ref 5–15)
BUN: 19 mg/dL (ref 6–20)
CO2: 27 mmol/L (ref 22–32)
Calcium: 8.8 mg/dL — ABNORMAL LOW (ref 8.9–10.3)
Chloride: 100 mmol/L — ABNORMAL LOW (ref 101–111)
Creatinine, Ser: 1.06 mg/dL — ABNORMAL HIGH (ref 0.44–1.00)
GFR calc Af Amer: >60 mL/min (ref >60)
GFR calc non Af Amer: 52 mL/min — ABNORMAL LOW (ref >60)
Glucose, Bld: 129 mg/dL — ABNORMAL HIGH (ref 65–99)
Potassium: 3.2 mmol/L — ABNORMAL LOW (ref 3.5–5.1)
Sodium: 139 mmol/L (ref 135–145)
Total Bilirubin: 1.6 mg/dL — ABNORMAL HIGH (ref 0.3–1.2)
Total Protein: 7.4 g/dL (ref 6.5–8.1)

## 2018-02-06 LAB — LACTIC ACID, PLASMA: Lactic Acid, Venous: 2.3 mmol/L (ref 0.5–1.9)

## 2018-02-06 MED ORDER — TRAMADOL HCL 50 MG PO TABS: 50.0000 mg | ORAL_TABLET | Freq: Four times a day (QID) | ORAL | 0 refills | Status: AC | PRN

## 2018-02-06 MED ORDER — SODIUM CHLORIDE 0.9 % IV BOLUS (SEPSIS)
500.0000 mL | Freq: Once | INTRAVENOUS | Status: AC
  Administered 2018-02-06: 500 mL via INTRAVENOUS

## 2018-02-06 MED ORDER — CLINDAMYCIN PHOSPHATE 600 MG/50ML IV SOLN
600.0000 mg | Freq: Once | INTRAVENOUS | Status: AC
  Administered 2018-02-06: 600 mg via INTRAVENOUS
  Filled 2018-02-06: qty 50

## 2018-02-06 MED ORDER — CLINDAMYCIN HCL 300 MG PO CAPS: 300.0000 mg | ORAL_CAPSULE | Freq: Three times a day (TID) | ORAL | 0 refills | Status: AC

## 2018-02-06 NOTE — ED Triage Notes (Signed)
Says pain, swelling and red area on left foot/ankle.  Also has dark urine recently, no urinary sx.  Had fever at home  101.6.  Has been taking aleve

## 2018-02-06 NOTE — ED Notes (Signed)
First Nurse Note:  Critical lab - Lactic acid 2.3 called by Demetria in Lab.  [place] Nurse made aware.

## 2018-02-06 NOTE — ED Notes (Signed)
First Nurse Note:  Patient to Rm 12 with Tommy / Tech.  [place] RN aware of placement in room 12 and of Lactic acid of 2.3.

## 2018-02-06 NOTE — ED Notes (Signed)
ED Provider at bedside. 

## 2018-02-06 NOTE — ED Provider Notes (Signed)
[facility] Emergency Department Provider Note    None    (approximate)  I have reviewed the triage vital signs and the nursing notes.   HISTORY  Chief Complaint No chief complaint on file.    HPI Deanna Grant is a 72 y.o. female with chief complaint of left foot pain that is steadily progressed over the past several days.  Denies any trauma.  Has had low-grade temperature and felt like she had a fever at home to 101.6 reportedly.  She has been taking Aleve without any improvement.  Denies any history of diabetes.  No history of blood clots.  Denies any nausea vomiting or diarrhea.  Is never had pain like this before.  States she has a family history of gout but she is never personally had that.  Past Medical History:  Diagnosis Date  . Angina at rest [facility])   . Arthritis 2010  . Cancer ([place]) 1977   cervical  . Degenerative disc disease, lumbar   . Heart murmur   . Hypertension 2014   Family History  Problem Relation Age of Onset  . Colon cancer Father   . Pancreatic cancer Brother   . Lung cancer Cousin    Past Surgical History:  Procedure Laterality Date  . ABDOMINAL HYSTERECTOMY  1977  . BREAST BIOPSY Right 2014   stereo, neg  . COLONOSCOPY  2002  . COLONOSCOPY WITH PROPOFOL N/A 12/14/2017   Procedure: COLONOSCOPY WITH PROPOFOL;  Surgeon: Lollie Sails, MD;  Location: [facility] ENDOSCOPY;  Service: Endoscopy;  Laterality: N/A;  . COLONOSCOPY WITH PROPOFOL N/A 12/15/2017   Procedure: COLONOSCOPY WITH PROPOFOL;  Surgeon: Lollie Sails, MD;  Location: [facility] ENDOSCOPY;  Service: Endoscopy;  Laterality: N/A;   Patient Active Problem List   Diagnosis Date Noted  . Chest wall mass 11/22/2017  . Hypertension       Prior to Admission medications   Medication Sig Start Date End Date Taking? Authorizing Provider  Acetaminophen (ARTHRITIS PAIN PO) Take by mouth daily.    [provider]  ALPRAZolam Duanne Moron) 0.25 MG tablet Take 0.25 mg  by mouth at bedtime as needed for sleep.    [provider]  Cholecalciferol (VITAMIN D3) 2000 units TABS Take by mouth daily.    [provider]  clindamycin (CLEOCIN) 300 MG capsule Take 1 capsule (300 mg total) by mouth 3 (three) times daily for 7 days. 02/06/18 02/13/18  Merlyn Lot, MD  cyclobenzaprine (FLEXERIL) 5 MG tablet Take 1 tablet (5 mg total) by mouth 3 (three) times daily as needed for muscle spasms. Patient not taking: Reported on 12/14/2017 09/29/17   Menshew, Dannielle Karvonen, PA-C  diclofenac (VOLTAREN) 50 MG EC tablet Take 1 tablet (50 mg total) by mouth 2 (two) times daily. 09/29/17   Menshew, Dannielle Karvonen, PA-C  simvastatin (ZOCOR) 20 MG tablet Take 20 mg by mouth daily.    [provider]  traMADol (ULTRAM) 50 MG tablet Take 1 tablet (50 mg total) by mouth every 6 (six) hours as needed. 02/06/18 02/06/19  Merlyn Lot, MD  triamterene-hydrochlorothiazide (DYAZIDE) 37.5-25 MG per capsule Take 1 capsule by mouth daily.    [provider]    Allergies Asa [aspirin] and Penicillins    Social History Social History   Tobacco Use  . Smoking status: Current Every Day Smoker    Packs/day: 0.50    Years: 30.00    Pack years: 15.00  . Smokeless tobacco: Never Used  Substance Use Topics  . Alcohol use: No  . Drug use: No    Review of Systems Patient denies headaches, rhinorrhea, blurry vision, numbness, shortness of breath, chest pain, edema, cough, abdominal pain, nausea, vomiting, diarrhea, dysuria, fevers, rashes or hallucinations unless otherwise stated above in HPI. ____________________________________________   PHYSICAL EXAM:  VITAL SIGNS: Vitals:   02/06/18 1109  BP: 138/75  Pulse: 100  Resp: 16  Temp: 98.8 F (37.1 C)  SpO2: 98%    Constitutional: Alert and oriented. Well appearing and in no acute distress. Eyes: Conjunctivae are normal.  Head: Atraumatic. Nose: No congestion/rhinnorhea. Mouth/Throat:  Mucous membranes are moist.   Neck: No stridor. Painless ROM.  Cardiovascular: Normal rate, regular rhythm. Grossly normal heart sounds.  Good peripheral circulation. Respiratory: Normal respiratory effort.  No retractions. Lungs CTAB. Gastrointestinal: Soft and nontender. No distention. No abdominal bruits. No CVA tenderness. Genitourinary:  Musculoskeletal: Swelling erythema and tenderness over the dorsum of the left foot midfoot. 2+ DP and PT pulses.   No effusion.  Painless range of motion.  No crepitus or blistering..  No joint effusions. Neurologic:  Normal speech and language. No gross focal neurologic deficits are appreciated. No facial droop Skin:  Skin is warm, dry and intact. No rash noted. Psychiatric: Mood and affect are normal. Speech and behavior are normal.  ____________________________________________   LABS (all labs ordered are listed, but only abnormal results are displayed)  Results for orders placed or performed during the hospital encounter of 02/06/18 (from the past 24 hour(s))  Lactic acid, plasma     Status: Abnormal   Collection Time: 02/06/18 11:14 AM  Result Value Ref Range   Lactic Acid, Venous 2.3 (HH) 0.5 - 1.9 mmol/L  Comprehensive metabolic panel     Status: Abnormal   Collection Time: 02/06/18 11:14 AM  Result Value Ref Range   Sodium 139 135 - 145 mmol/L   Potassium 3.2 (L) 3.5 - 5.1 mmol/L   Chloride 100 (L) 101 - 111 mmol/L   CO2 27 22 - 32 mmol/L   Glucose, Bld 129 (H) 65 - 99 mg/dL   BUN 19 6 - 20 mg/dL   Creatinine, Ser 1.06 (H) 0.44 - 1.00 mg/dL   Calcium 8.8 (L) 8.9 - 10.3 mg/dL   Total Protein 7.4 6.5 - 8.1 g/dL   Albumin 3.9 3.5 - 5.0 g/dL   AST 36 15 - 41 U/L   ALT 19 14 - 54 U/L   Alkaline Phosphatase 57 38 - 126 U/L   Total Bilirubin 1.6 (H) 0.3 - 1.2 mg/dL   GFR calc non Af Amer 52 (L) >60 mL/min   GFR calc Af Amer >60 >60 mL/min   Anion gap 12 5 - 15  CBC with Differential     Status: None   Collection Time: 02/06/18 11:14 AM   Result Value Ref Range   WBC 7.4 3.6 - 11.0 K/uL   RBC 4.38 3.80 - 5.20 MIL/uL   Hemoglobin 14.3 12.0 - 16.0 g/dL   HCT 41.4 35.0 - 47.0 %   MCV 94.5 80.0 - 100.0 fL   MCH 32.7 26.0 - 34.0 pg   MCHC 34.6 32.0 - 36.0 g/dL   RDW 13.3 11.5 - 14.5 %   Platelets 179 150 - 440 K/uL   Neutrophils Relative % 66 %   Neutro Abs 4.9 1.4 - 6.5 K/uL   Lymphocytes Relative 24 %   Lymphs Abs 1.8 1.0 - 3.6 K/uL   Monocytes Relative 7 %  Monocytes Absolute 0.5 0.2 - 0.9 K/uL   Eosinophils Relative 2 %   Eosinophils Absolute 0.1 0 - 0.7 K/uL   Basophils Relative 1 %   Basophils Absolute 0.0 0 - 0.1 K/uL   ____________________________________________ ____________________________________________  RADIOLOGY  I personally reviewed all radiographic images ordered to evaluate for the above acute complaints and reviewed radiology reports and findings.  These findings were personally discussed with the patient.  Please see medical record for radiology report.  ____________________________________________   PROCEDURES  Procedure(s) performed:  Procedures    Critical Care performed: no ____________________________________________   INITIAL IMPRESSION / ASSESSMENT AND PLAN / ED COURSE  Pertinent labs & imaging results that were available during my care of the patient were reviewed by me and considered in my medical decision making (see chart for details).  DDX: Celulitis, lymphangitis, DVT, fracture, necrotizing soft tissue infection, septic arthritis, gout  Deanna Grant is a 72 y.o. who presents to the ED with with foot swelling redness concerning for cellulitis.  No evidence of gas blistering or crepitus suggest necrotizing soft tissue infection and the patient has no white count or fever.  Nonspecific lactate elevation most likely secondary to dehydration.  Possible component of Sirs secondary to cellulitis of the patient is otherwise well-appearing.  Right foot shows no evidence of  foreign body or fracture.  Ultrasound shows no evidence of DVT.  Clinically most consistent with cellulitis.  Patient given IV fluids as well as IV dose of clindamycin.  She is tolerating oral hydration and pain is controlled.  Stable and appropriate for outpatient follow-up.  Have discussed with the patient and available family all diagnostics and treatments performed thus far and all questions were answered to the best of my ability. The patient demonstrates understanding and agreement with plan.     ____________________________________________   FINAL CLINICAL IMPRESSION(S) / ED DIAGNOSES  Final diagnoses:  Cellulitis of left foot      NEW MEDICATIONS STARTED DURING THIS VISIT:  New Prescriptions   CLINDAMYCIN (CLEOCIN) 300 MG CAPSULE    Take 1 capsule (300 mg total) by mouth 3 (three) times daily for 7 days.   TRAMADOL (ULTRAM) 50 MG TABLET    Take 1 tablet (50 mg total) by mouth every 6 (six) hours as needed.     Note:  This document was prepared using Dragon voice recognition software and may include unintentional dictation errors.    Merlyn Lot, MD 02/06/18 [phone]

## 2018-02-06 NOTE — ED Notes (Signed)
FN: pt presents with left foot pain, hx of gout.

## 2018-04-26 DIAGNOSIS — Z8739 Personal history of other diseases of the musculoskeletal system and connective tissue: Secondary | ICD-10-CM | POA: Insufficient documentation

## 2018-09-04 DIAGNOSIS — F321 Major depressive disorder, single episode, moderate: Secondary | ICD-10-CM | POA: Insufficient documentation

## 2018-09-04 DIAGNOSIS — R413 Other amnesia: Secondary | ICD-10-CM | POA: Insufficient documentation

## 2018-09-11 ENCOUNTER — Ambulatory Visit: Payer: Medicare Other | Admitting: General Surgery

## 2018-11-07 ENCOUNTER — Encounter: Payer: Self-pay | Admitting: *Deleted

## 2018-12-21 ENCOUNTER — Encounter (HOSPITAL_COMMUNITY): Payer: Self-pay

## 2018-12-21 ENCOUNTER — Emergency Department (HOSPITAL_COMMUNITY): Admit: 2018-12-21 | Payer: Medicare Other | Admitting: Cardiovascular Disease

## 2018-12-21 SURGERY — CORONARY/GRAFT ACUTE MI REVASCULARIZATION
Anesthesia: LOCAL

## 2019-07-16 ENCOUNTER — Other Ambulatory Visit: Payer: Self-pay | Admitting: Internal Medicine

## 2019-11-11 DIAGNOSIS — K529 Noninfective gastroenteritis and colitis, unspecified: Secondary | ICD-10-CM | POA: Insufficient documentation

## 2019-11-11 DIAGNOSIS — K649 Unspecified hemorrhoids: Secondary | ICD-10-CM | POA: Insufficient documentation

## 2019-11-11 DIAGNOSIS — K625 Hemorrhage of anus and rectum: Secondary | ICD-10-CM | POA: Insufficient documentation

## 2019-11-25 ENCOUNTER — Other Ambulatory Visit: Payer: Self-pay | Admitting: Internal Medicine

## 2019-11-25 DIAGNOSIS — Z1231 Encounter for screening mammogram for malignant neoplasm of breast: Secondary | ICD-10-CM

## 2019-12-13 DIAGNOSIS — C50919 Malignant neoplasm of unspecified site of unspecified female breast: Secondary | ICD-10-CM

## 2019-12-13 HISTORY — DX: Malignant neoplasm of unspecified site of unspecified female breast: C50.919

## 2020-01-02 ENCOUNTER — Ambulatory Visit
Admission: RE | Admit: 2020-01-02 | Discharge: 2020-01-02 | Disposition: A | Discharge status: Home / Self Care | Payer: Medicare Other | Source: Ambulatory Visit | Attending: Internal Medicine | Admitting: Internal Medicine

## 2020-01-02 DIAGNOSIS — Z1231 Encounter for screening mammogram for malignant neoplasm of breast: Secondary | ICD-10-CM | POA: Insufficient documentation

## 2020-01-07 ENCOUNTER — Other Ambulatory Visit: Payer: Self-pay | Admitting: Internal Medicine

## 2020-01-07 DIAGNOSIS — R928 Other abnormal and inconclusive findings on diagnostic imaging of breast: Secondary | ICD-10-CM

## 2020-01-07 DIAGNOSIS — N6489 Other specified disorders of breast: Secondary | ICD-10-CM

## 2020-01-07 DIAGNOSIS — R921 Mammographic calcification found on diagnostic imaging of breast: Secondary | ICD-10-CM

## 2020-01-10 ENCOUNTER — Ambulatory Visit
Admission: RE | Admit: 2020-01-10 | Discharge: 2020-01-10 | Disposition: A | Discharge status: Home / Self Care | Payer: Medicare Other | Source: Ambulatory Visit | Attending: Internal Medicine | Admitting: Internal Medicine

## 2020-01-10 DIAGNOSIS — R921 Mammographic calcification found on diagnostic imaging of breast: Secondary | ICD-10-CM | POA: Diagnosis present

## 2020-01-10 DIAGNOSIS — R928 Other abnormal and inconclusive findings on diagnostic imaging of breast: Secondary | ICD-10-CM | POA: Diagnosis present

## 2020-01-10 DIAGNOSIS — N6489 Other specified disorders of breast: Secondary | ICD-10-CM | POA: Diagnosis present

## 2020-01-13 ENCOUNTER — Other Ambulatory Visit: Payer: Self-pay | Admitting: Internal Medicine

## 2020-01-13 DIAGNOSIS — N6489 Other specified disorders of breast: Secondary | ICD-10-CM

## 2020-01-13 DIAGNOSIS — R921 Mammographic calcification found on diagnostic imaging of breast: Secondary | ICD-10-CM

## 2020-01-13 DIAGNOSIS — R928 Other abnormal and inconclusive findings on diagnostic imaging of breast: Secondary | ICD-10-CM

## 2020-01-13 DIAGNOSIS — D0510 Intraductal carcinoma in situ of unspecified breast: Secondary | ICD-10-CM | POA: Insufficient documentation

## 2020-01-22 ENCOUNTER — Ambulatory Visit
Admission: RE | Admit: 2020-01-22 | Discharge: 2020-01-22 | Disposition: A | Discharge status: Home / Self Care | Payer: Medicare Other | Source: Ambulatory Visit | Attending: Internal Medicine | Admitting: Internal Medicine

## 2020-01-22 DIAGNOSIS — N6489 Other specified disorders of breast: Secondary | ICD-10-CM | POA: Diagnosis present

## 2020-01-22 DIAGNOSIS — R921 Mammographic calcification found on diagnostic imaging of breast: Secondary | ICD-10-CM | POA: Insufficient documentation

## 2020-01-22 DIAGNOSIS — R928 Other abnormal and inconclusive findings on diagnostic imaging of breast: Secondary | ICD-10-CM

## 2020-01-22 HISTORY — PX: BREAST BIOPSY: SHX20

## 2020-01-24 ENCOUNTER — Other Ambulatory Visit: Payer: Self-pay | Admitting: Anatomic Pathology & Clinical Pathology

## 2020-01-24 NOTE — Progress Notes (Signed)
Patient notified of biopsy results by Electa Sniff at [facility].  Patient requests consult with Dr. Bary Castilla , and would like to wait on Med/Onc consult.  Will scheduled surgical consult Monday as office is closed for today.

## 2020-01-27 NOTE — Progress Notes (Signed)
Due to insurance change, patient unable to see Dr. Bary Castilla.  Scheduled with Dr. Peyton Najjar on 01/30/20 at 2:45 per patient request.

## 2020-01-29 ENCOUNTER — Other Ambulatory Visit: Payer: Self-pay

## 2020-01-30 ENCOUNTER — Other Ambulatory Visit: Payer: Self-pay

## 2020-01-30 NOTE — Progress Notes (Signed)
Patient requests reschedule of today's surgical consult due to weather.  Left message on nurse line.  Office closed per front desk line.  Discussed again with patient scheduling Med/Onc consult.  She would like to wait until she talks to Dr. Windell Moment.

## 2020-02-03 ENCOUNTER — Ambulatory Visit: Payer: Self-pay | Admitting: General Surgery

## 2020-02-03 NOTE — H&P (Signed)
PATIENT PROFILE: Deanna Grant is a 74 y.o. female who presents to the Clinic for consultation at the request of Dr. Hande for evaluation of breast cancer.  PCP:  Hande, Vishwanath Handattur, MD  HISTORY OF PRESENT ILLNESS: [name] reports she had her screening mammogram.  Screening mammogram shows suspicious calcification.  This led to diagnostic mammogram and ultrasound.  Diagnostic mammogram confirmed the suspicious calcification.  There was a 4 cm span of calcifications.  Both scans of the calcifications were biopsied.  Both shows DCIS, high-grade with comedonecrosis.  Patient denies any palpable mass.  Patient denies any skin changes.  Patient denies any nipple retraction or discharge.  Family history of breast cancer: None Family history of other cancers: None Menarche: 74 years old Menopause: In her 40s Used OCP: None Used estrogen and progesterone therapy: None History of Radiation to the chest: None Numbe of pregnancies: 3  PROBLEM LIST: Problem List  Date Reviewed: 01/20/2020         Noted   Memory problem 09/04/2018   Personal history of gout 04/26/2018   Tobacco user 05/12/2017   Low serum vitamin D 05/12/2017   Essential hypertension, benign 05/12/2017      GENERAL REVIEW OF SYSTEMS:   General ROS: negative for - chills, fatigue, fever, weight gain or weight loss Allergy and Immunology ROS: negative for - hives  Hematological and Lymphatic ROS: negative for - bleeding problems or bruising, negative for palpable nodes Endocrine ROS: negative for - heat or cold intolerance, hair changes Respiratory ROS: negative for - cough, shortness of breath or wheezing Cardiovascular ROS: no chest pain or palpitations GI ROS: negative for nausea, vomiting, abdominal pain, diarrhea, constipation Musculoskeletal ROS: negative for - joint swelling or muscle pain Neurological ROS: negative for - confusion, syncope Dermatological ROS: negative for pruritus and rash Psychiatric: negative  for anxiety, depression, difficulty sleeping and memory loss  MEDICATIONS: Current Outpatient Medications  Medication Sig Dispense Refill  . acetaminophen (TYLENOL) 500 mg capsule Take 500 mg by mouth 2 (two) times daily as needed for Pain.    . allopurinoL (ZYLOPRIM) 100 MG tablet Take 2 tablets (200 mg total) by mouth once daily 180 tablet 1  . aspirin 81 MG chewable tablet Take 1 tablet by mouth every morning before breakfast    . cholecalciferol (VITAMIN D3) 2,000 unit tablet Take 1 tablet by mouth once daily    . cyclobenzaprine (FLEXERIL) 5 MG tablet Take 1 tablet (5 mg total) by mouth 2 (two) times daily as needed for Muscle spasms 60 tablet 5  . olmesartan (BENICAR) 40 MG tablet TAKE 1 TABLET(40 MG) BY MOUTH EVERY DAY 90 tablet 1  . omeprazole (PRILOSEC) 20 MG DR capsule TAKE 1 CAPSULE(20 MG) BY MOUTH EVERY DAY 30 capsule 3  . polyethylene glycol (MIRALAX) powder Take 17 g by mouth once daily. Mix in 4-8ounces of fluid prior to taking. 1530 g 1  . simvastatin (ZOCOR) 20 MG tablet TAKE 1 TABLET(20 MG) BY MOUTH EVERY NIGHT 90 tablet 1  . thiamine HCl (VITAMIN B-1 ORAL) Take by mouth    . amLODIPine (NORVASC) 2.5 MG tablet Take 1 tablet (2.5 mg total) by mouth once daily 30 tablet 5   No current facility-administered medications for this visit.     ALLERGIES: Penicillin  PAST MEDICAL HISTORY: Past Medical History:  Diagnosis Date  . Hypercholesterolemia     PAST SURGICAL HISTORY: Past Surgical History:  Procedure Laterality Date  . COLONOSCOPY  10/29/2007   Dr. D.   Siegel @ [facility] - Hyperplastic, [facility](f)(60s)  . COLONOSCOPY  12/14/2017   Hyperplastic colon polyp/Rescheduled to next day 12/15/2017  . COLONOSCOPY  12/15/2017    Tubular adenoma of the colon/Repeat 5yrs/MUS  . HYSTERECTOMY VAGINAL  1973     FAMILY HISTORY: No family history on file.   SOCIAL HISTORY: Social History   Socioeconomic History  . Marital status: Widowed    Spouse name: Not on file  . Number  of children: Not on file  . Years of education: Not on file  . Highest education level: Not on file  Occupational History  . Not on file  Social Needs  . Financial resource strain: Not on file  . Food insecurity    Worry: Not on file    Inability: Not on file  . Transportation needs    Medical: Not on file    Non-medical: Not on file  Tobacco Use  . Smoking status: Current Every Day Smoker  . Smokeless tobacco: Never Used  Substance and Sexual Activity  . Alcohol use: No  . Drug use: Defer  . Sexual activity: Not on file  Other Topics Concern  . Not on file  Social History Narrative  . Not on file    PHYSICAL EXAM: Vitals:   02/03/20 1447  BP: (!) 172/91  Pulse: 86   Body mass index is 32.03 kg/m. Weight: 74.4 kg (164 lb)   GENERAL: Alert, active, oriented x3  HEENT: Pupils equal reactive to light. Extraocular movements are intact. Sclera clear. Palpebral conjunctiva normal red color.Pharynx clear.  NECK: Supple with no palpable mass and no adenopathy.  LUNGS: Sound clear with no rales rhonchi or wheezes.  HEART: Regular rhythm S1 and S2 without murmur.  BREAST: breasts appear normal, no suspicious masses, no skin or nipple changes or axillary nodes.  ABDOMEN: Soft and depressible, nontender with no palpable mass, no hepatomegaly.  EXTREMITIES: Well-developed well-nourished symmetrical with no dependent edema.  NEUROLOGICAL: Awake alert oriented, facial expression symmetrical, moving all extremities.  REVIEW OF DATA: I have reviewed the following data today: Appointment on 01/13/2020  Component Date Value  . WBC (White Blood Cell Co* 01/13/2020 5.5   . RBC (Red Blood Cell Coun* 01/13/2020 4.34   . Hemoglobin 01/13/2020 14.7   . Hematocrit 01/13/2020 43.6   . MCV (Mean Corpuscular Vo* 01/13/2020 100.5*  . MCH (Mean Corpuscular He* 01/13/2020 33.9*  . MCHC (Mean Corpuscular H* 01/13/2020 33.7   . Platelet Count 01/13/2020 164   . RDW-CV (Red Cell  Distrib* 01/13/2020 12.8   . MPV (Mean Platelet Volum* 01/13/2020 11.0   . Neutrophils 01/13/2020 2.68   . Lymphocytes 01/13/2020 2.31   . Monocytes 01/13/2020 0.29   . Eosinophils 01/13/2020 0.18   . Basophils 01/13/2020 0.05   . Neutrophil % 01/13/2020 48.6   . Lymphocyte % 01/13/2020 41.9   . Monocyte % 01/13/2020 5.3   . Eosinophil % 01/13/2020 3.3   . Basophil% 01/13/2020 0.9   . Immature Granulocyte % 01/13/2020 0.0   . Immature Granulocyte Cou* 01/13/2020 0.00   . Glucose 01/13/2020 96   . Sodium 01/13/2020 140   . Potassium 01/13/2020 4.2   . Chloride 01/13/2020 105   . Carbon Dioxide (CO2) 01/13/2020 27.9   . Urea Nitrogen (BUN) 01/13/2020 16   . Creatinine 01/13/2020 1.0   . Glomerular Filtration Ra* 01/13/2020 55*  . Calcium 01/13/2020 9.8   . AST  01/13/2020 27   . ALT  01/13/2020 15   .   Alk Phos (alkaline Phosp* 01/13/2020 89   . Albumin 01/13/2020 4.1   . Bilirubin, Total 01/13/2020 0.9   . Protein, Total 01/13/2020 6.3   . A/G Ratio 01/13/2020 1.9   . Cholesterol, Total 01/13/2020 159   . Triglyceride 01/13/2020 131   . HDL (High Density Lipopr* 01/13/2020 37.5   . LDL Calculated 01/13/2020 95   . VLDL Cholesterol 01/13/2020 26   . Cholesterol/HDL Ratio 01/13/2020 4.2   . Urine Albumin, Random 01/13/2020 17   . Thyroid Stimulating Horm* 01/13/2020 4.128   . Color 01/13/2020 Yellow   . Clarity 01/13/2020 Cloudy*  . Specific Gravity 01/13/2020 >=1.030   . pH, Urine 01/13/2020 5.5   . Protein, Urinalysis 01/13/2020 Negative   . Glucose, Urinalysis 01/13/2020 Negative   . Ketones, Urinalysis 01/13/2020 Negative   . Blood, Urinalysis 01/13/2020 Negative   . Nitrite, Urinalysis 01/13/2020 Negative   . Leukocyte Esterase, Urin* 01/13/2020 Small*  . White Blood Cells, Urina* 01/13/2020 4-10*  . Red Blood Cells, Urinaly* 01/13/2020 0-3   . Bacteria, Urinalysis 01/13/2020 Many*  . Squamous Epithelial Cell* 01/13/2020 Moderate*  . Urine Culture, Routine -*  01/13/2020 Final report*  . Result 1 - [facility] 01/13/2020 Escherichia coli*  . Result 2 - [facility] 01/13/2020 Comment   . Antimicrobial Susceptibi* 01/13/2020 Comment      ASSESSMENT: Ms. Lape is a 74 y.o. female presenting for consultation for right breast cancer.    Patient was oriented again about the pathology results (DCIS of the right breast). Surgical alternatives were discussed with patient including partial vs total mastectomy. Surgical technique and post operative care was discussed with patient. Risk of surgery was discussed with patient including but not limited to: wound infection, seroma, hematoma, brachial plexopathy, mondor's disease (thrombosis of small veins of breast), chronic wound pain, breast lymphedema, altered sensation to the nipple and cosmesis among others.   Patient also has a suspicious lesion with raised red area.  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  This is highly suspicious of basal cell or squamous cell carcinoma.  Patient oriented about excision.  She agreed to proceed with excision.  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 Right breast needle guided partial mastectomy with sentinel lymph node biopsy. (19301, 38525) 2. Excision of left leg skin lesion (11402) 3.  CBC, CMP done 01/13/20 4. Hold aspirin 5 days before surgery 5. Contact us if has any question or concern  Patient and her daughter verbalized understanding, all questions were answered, and were agreeable with the plan outlined above.   I spent a total of 60 minutes in both face-to-face and non-face-to-face activities for this visit on the date of this encounter.  Edgardo Cintron-Diaz, MD  Electronically signed by Edgardo Cintron-Diaz, MD 

## 2020-02-03 NOTE — H&P (View-Only) (Signed)
PATIENT PROFILE: Deanna Grant is a 74 y.o. female who presents to the Clinic for consultation at the request of Dr. Hande for evaluation of breast cancer.  PCP:  Hande, Vishwanath Handattur, MD  HISTORY OF PRESENT ILLNESS: [name] reports she had her screening mammogram.  Screening mammogram shows suspicious calcification.  This led to diagnostic mammogram and ultrasound.  Diagnostic mammogram confirmed the suspicious calcification.  There was a 4 cm span of calcifications.  Both scans of the calcifications were biopsied.  Both shows DCIS, high-grade with comedonecrosis.  Patient denies any palpable mass.  Patient denies any skin changes.  Patient denies any nipple retraction or discharge.  Family history of breast cancer: None Family history of other cancers: None Menarche: 74 years old Menopause: In her 40s Used OCP: None Used estrogen and progesterone therapy: None History of Radiation to the chest: None Numbe of pregnancies: 3  PROBLEM LIST: Problem List  Date Reviewed: 01/20/2020         Noted   Memory problem 09/04/2018   Personal history of gout 04/26/2018   Tobacco user 05/12/2017   Low serum vitamin D 05/12/2017   Essential hypertension, benign 05/12/2017      GENERAL REVIEW OF SYSTEMS:   General ROS: negative for - chills, fatigue, fever, weight gain or weight loss Allergy and Immunology ROS: negative for - hives  Hematological and Lymphatic ROS: negative for - bleeding problems or bruising, negative for palpable nodes Endocrine ROS: negative for - heat or cold intolerance, hair changes Respiratory ROS: negative for - cough, shortness of breath or wheezing Cardiovascular ROS: no chest pain or palpitations GI ROS: negative for nausea, vomiting, abdominal pain, diarrhea, constipation Musculoskeletal ROS: negative for - joint swelling or muscle pain Neurological ROS: negative for - confusion, syncope Dermatological ROS: negative for pruritus and rash Psychiatric: negative  for anxiety, depression, difficulty sleeping and memory loss  MEDICATIONS: Current Outpatient Medications  Medication Sig Dispense Refill  . acetaminophen (TYLENOL) 500 mg capsule Take 500 mg by mouth 2 (two) times daily as needed for Pain.    . allopurinoL (ZYLOPRIM) 100 MG tablet Take 2 tablets (200 mg total) by mouth once daily 180 tablet 1  . aspirin 81 MG chewable tablet Take 1 tablet by mouth every morning before breakfast    . cholecalciferol (VITAMIN D3) 2,000 unit tablet Take 1 tablet by mouth once daily    . cyclobenzaprine (FLEXERIL) 5 MG tablet Take 1 tablet (5 mg total) by mouth 2 (two) times daily as needed for Muscle spasms 60 tablet 5  . olmesartan (BENICAR) 40 MG tablet TAKE 1 TABLET(40 MG) BY MOUTH EVERY DAY 90 tablet 1  . omeprazole (PRILOSEC) 20 MG DR capsule TAKE 1 CAPSULE(20 MG) BY MOUTH EVERY DAY 30 capsule 3  . polyethylene glycol (MIRALAX) powder Take 17 g by mouth once daily. Mix in 4-8ounces of fluid prior to taking. 1530 g 1  . simvastatin (ZOCOR) 20 MG tablet TAKE 1 TABLET(20 MG) BY MOUTH EVERY NIGHT 90 tablet 1  . thiamine HCl (VITAMIN B-1 ORAL) Take by mouth    . amLODIPine (NORVASC) 2.5 MG tablet Take 1 tablet (2.5 mg total) by mouth once daily 30 tablet 5   No current facility-administered medications for this visit.     ALLERGIES: Penicillin  PAST MEDICAL HISTORY: Past Medical History:  Diagnosis Date  . Hypercholesterolemia     PAST SURGICAL HISTORY: Past Surgical History:  Procedure Laterality Date  . COLONOSCOPY  10/29/2007   Dr. D.   Siegel @ [facility] - Hyperplastic, [facility](f)(60s)  . COLONOSCOPY  12/14/2017   Hyperplastic colon polyp/Rescheduled to next day 12/15/2017  . COLONOSCOPY  12/15/2017    Tubular adenoma of the colon/Repeat 5yrs/MUS  . HYSTERECTOMY VAGINAL  1973     FAMILY HISTORY: No family history on file.   SOCIAL HISTORY: Social History   Socioeconomic History  . Marital status: Widowed    Spouse name: Not on file  . Number  of children: Not on file  . Years of education: Not on file  . Highest education level: Not on file  Occupational History  . Not on file  Social Needs  . Financial resource strain: Not on file  . Food insecurity    Worry: Not on file    Inability: Not on file  . Transportation needs    Medical: Not on file    Non-medical: Not on file  Tobacco Use  . Smoking status: Current Every Day Smoker  . Smokeless tobacco: Never Used  Substance and Sexual Activity  . Alcohol use: No  . Drug use: Defer  . Sexual activity: Not on file  Other Topics Concern  . Not on file  Social History Narrative  . Not on file    PHYSICAL EXAM: Vitals:   02/03/20 1447  BP: (!) 172/91  Pulse: 86   Body mass index is 32.03 kg/m. Weight: 74.4 kg (164 lb)   GENERAL: Alert, active, oriented x3  HEENT: Pupils equal reactive to light. Extraocular movements are intact. Sclera clear. Palpebral conjunctiva normal red color.Pharynx clear.  NECK: Supple with no palpable mass and no adenopathy.  LUNGS: Sound clear with no rales rhonchi or wheezes.  HEART: Regular rhythm S1 and S2 without murmur.  BREAST: breasts appear normal, no suspicious masses, no skin or nipple changes or axillary nodes.  ABDOMEN: Soft and depressible, nontender with no palpable mass, no hepatomegaly.  EXTREMITIES: Well-developed well-nourished symmetrical with no dependent edema.  NEUROLOGICAL: Awake alert oriented, facial expression symmetrical, moving all extremities.  REVIEW OF DATA: I have reviewed the following data today: Appointment on 01/13/2020  Component Date Value  . WBC (White Blood Cell Co* 01/13/2020 5.5   . RBC (Red Blood Cell Coun* 01/13/2020 4.34   . Hemoglobin 01/13/2020 14.7   . Hematocrit 01/13/2020 43.6   . MCV (Mean Corpuscular Vo* 01/13/2020 100.5*  . MCH (Mean Corpuscular He* 01/13/2020 33.9*  . MCHC (Mean Corpuscular H* 01/13/2020 33.7   . Platelet Count 01/13/2020 164   . RDW-CV (Red Cell  Distrib* 01/13/2020 12.8   . MPV (Mean Platelet Volum* 01/13/2020 11.0   . Neutrophils 01/13/2020 2.68   . Lymphocytes 01/13/2020 2.31   . Monocytes 01/13/2020 0.29   . Eosinophils 01/13/2020 0.18   . Basophils 01/13/2020 0.05   . Neutrophil % 01/13/2020 48.6   . Lymphocyte % 01/13/2020 41.9   . Monocyte % 01/13/2020 5.3   . Eosinophil % 01/13/2020 3.3   . Basophil% 01/13/2020 0.9   . Immature Granulocyte % 01/13/2020 0.0   . Immature Granulocyte Cou* 01/13/2020 0.00   . Glucose 01/13/2020 96   . Sodium 01/13/2020 140   . Potassium 01/13/2020 4.2   . Chloride 01/13/2020 105   . Carbon Dioxide (CO2) 01/13/2020 27.9   . Urea Nitrogen (BUN) 01/13/2020 16   . Creatinine 01/13/2020 1.0   . Glomerular Filtration Ra* 01/13/2020 55*  . Calcium 01/13/2020 9.8   . AST  01/13/2020 27   . ALT  01/13/2020 15   .   Alk Phos (alkaline Phosp* 01/13/2020 89   . Albumin 01/13/2020 4.1   . Bilirubin, Total 01/13/2020 0.9   . Protein, Total 01/13/2020 6.3   . A/G Ratio 01/13/2020 1.9   . Cholesterol, Total 01/13/2020 159   . Triglyceride 01/13/2020 131   . HDL (High Density Lipopr* 01/13/2020 37.5   . LDL Calculated 01/13/2020 95   . VLDL Cholesterol 01/13/2020 26   . Cholesterol/HDL Ratio 01/13/2020 4.2   . Urine Albumin, Random 01/13/2020 17   . Thyroid Stimulating Horm* 01/13/2020 4.128   . Color 01/13/2020 Yellow   . Clarity 01/13/2020 Cloudy*  . Specific Gravity 01/13/2020 >=1.030   . pH, Urine 01/13/2020 5.5   . Protein, Urinalysis 01/13/2020 Negative   . Glucose, Urinalysis 01/13/2020 Negative   . Ketones, Urinalysis 01/13/2020 Negative   . Blood, Urinalysis 01/13/2020 Negative   . Nitrite, Urinalysis 01/13/2020 Negative   . Leukocyte Esterase, Urin* 01/13/2020 Small*  . White Blood Cells, Urina* 01/13/2020 4-10*  . Red Blood Cells, Urinaly* 01/13/2020 0-3   . Bacteria, Urinalysis 01/13/2020 Many*  . Squamous Epithelial Cell* 01/13/2020 Moderate*  . Urine Culture, Routine -*  01/13/2020 Final report*  . Result 1 - [facility] 01/13/2020 Escherichia coli*  . Result 2 - [facility] 01/13/2020 Comment   . Antimicrobial Susceptibi* 01/13/2020 Comment      ASSESSMENT: Ms. Melby is a 74 y.o. female presenting for consultation for right breast cancer.    Patient was oriented again about the pathology results (DCIS of the right breast). Surgical alternatives were discussed with patient including partial vs total mastectomy. Surgical technique and post operative care was discussed with patient. Risk of surgery was discussed with patient including but not limited to: wound infection, seroma, hematoma, brachial plexopathy, mondor's disease (thrombosis of small veins of breast), chronic wound pain, breast lymphedema, altered sensation to the nipple and cosmesis among others.   Patient also has a suspicious lesion with raised red area.  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  This is highly suspicious of basal cell or squamous cell carcinoma.  Patient oriented about excision.  She agreed to proceed with excision.  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 Right breast needle guided partial mastectomy with sentinel lymph node biopsy. (19301, 38525) 2. Excision of left leg skin lesion (11402) 3.  CBC, CMP done 01/13/20 4. Hold aspirin 5 days before surgery 5. Contact us if has any question or concern  Patient and her daughter verbalized understanding, all questions were answered, and were agreeable with the plan outlined above.   I spent a total of 60 minutes in both face-to-face and non-face-to-face activities for this visit on the date of this encounter.  Jaysen Wey Cintron-Diaz, MD  Electronically signed by Etty Isaac Cintron-Diaz, MD 

## 2020-02-04 ENCOUNTER — Other Ambulatory Visit: Payer: Self-pay | Admitting: General Surgery

## 2020-02-04 DIAGNOSIS — D0511 Intraductal carcinoma in situ of right breast: Secondary | ICD-10-CM

## 2020-02-05 ENCOUNTER — Other Ambulatory Visit: Payer: Self-pay | Admitting: General Surgery

## 2020-02-05 DIAGNOSIS — D0511 Intraductal carcinoma in situ of right breast: Secondary | ICD-10-CM

## 2020-02-06 ENCOUNTER — Other Ambulatory Visit: Payer: Self-pay

## 2020-02-06 ENCOUNTER — Encounter
Admission: RE | Admit: 2020-02-06 | Discharge: 2020-02-06 | Disposition: A | Discharge status: Home / Self Care | Payer: Medicare Other | Source: Ambulatory Visit | Attending: General Surgery | Admitting: General Surgery

## 2020-02-06 ENCOUNTER — Ambulatory Visit
Admission: RE | Admit: 2020-02-06 | Discharge: 2020-02-06 | Disposition: A | Discharge status: Home / Self Care | Payer: Medicare Other | Source: Ambulatory Visit | Attending: General Surgery | Admitting: General Surgery

## 2020-02-06 DIAGNOSIS — D0511 Intraductal carcinoma in situ of right breast: Secondary | ICD-10-CM | POA: Insufficient documentation

## 2020-02-06 MED ORDER — GADOBUTROL 1 MMOL/ML IV SOLN
7.0000 mL | Freq: Once | INTRAVENOUS | Status: AC | PRN
  Administered 2020-02-06: 12:00:00 7 mL via INTRAVENOUS

## 2020-02-06 NOTE — Patient Instructions (Signed)
Your procedure is scheduled on: Friday February 14, 2020 Report to Day Surgery. To find out your arrival time please call [phone] between 1PM - 3PM on Thursday February 13, 2020.  Remember: Instructions that are not followed completely may result in serious medical risk,  up to and including death, or upon the discretion of your surgeon and anesthesiologist your  surgery may need to be rescheduled.     _X__ 1. Do not eat food after midnight the night before your procedure.                 No gum chewing or hard candies. You may drink clear liquids up to 2 hours                 before you are scheduled to arrive for your surgery- DO not drink clear                 liquids within 2 hours of the start of your surgery.                 Clear Liquids include:  water, apple juice without pulp, clear Gatorade, G2 or                  Gatorade Zero (avoid Red/Purple/Blue), Black Coffee or Tea (Do not add                 anything to coffee or tea).  __X__2.  On the morning of surgery brush your teeth with toothpaste and water, you                may rinse your mouth with mouthwash if you wish.  Do not swallow any toothpaste of mouthwash.     _X__ 3.  No Alcohol for 24 hours before or after surgery.   _X__ 4.  Do Not Smoke or use e-cigarettes For 24 Hours Prior to Your Surgery.                 Do not use any chewable tobacco products for at least 6 hours prior to                 surgery.  __x__  6.  Notify your doctor if there is any change in your medical condition      (cold, fever, infections).     Do not wear jewelry, make-up, hairpins, clips or nail polish. Do not wear lotions, powders, or perfumes. You may wear deodorant. Do not shave 48 hours prior to surgery. Men may shave face and neck. Do not bring valuables to the hospital.    [facility] is not responsible for any belongings or valuables.  Contacts, dentures or bridgework may not be worn into surgery. Leave  your suitcase in the car. After surgery it may be brought to your room. For patients admitted to the hospital, discharge time is determined by your treatment team.   Patients discharged the day of surgery will not be allowed to drive home.   Make arrangements for someone to be with you for the first 24 hours of your Same Day Discharge.    Please read over the following fact sheets that you were given:     _x___ Take these medicines the morning of surgery with A SIP OF WATER:    1. amLODipine (NORVASC)   2. omeprazole (PRILOSEC)    __x__ Use CHG Soap (or wipes) as directed  __x__ Stop Anti-inflammatories on ibuprofen, Aleve, naproxen  and or BC powders.    __x__ Stop supplements until after surgery.   __X__ Do not start any herbal supplements before your surgery.

## 2020-02-12 ENCOUNTER — Other Ambulatory Visit
Admission: RE | Admit: 2020-02-12 | Discharge: 2020-02-12 | Disposition: A | Discharge status: Home / Self Care | Payer: Medicare Other | Source: Ambulatory Visit | Attending: General Surgery | Admitting: General Surgery

## 2020-02-12 ENCOUNTER — Other Ambulatory Visit: Payer: Self-pay

## 2020-02-12 DIAGNOSIS — Z20822 Contact with and (suspected) exposure to covid-19: Secondary | ICD-10-CM | POA: Diagnosis not present

## 2020-02-12 DIAGNOSIS — Z01812 Encounter for preprocedural laboratory examination: Secondary | ICD-10-CM | POA: Insufficient documentation

## 2020-02-12 LAB — SARS CORONAVIRUS 2 (TAT 6-24 HRS): SARS Coronavirus 2: NEGATIVE

## 2020-02-13 MED ORDER — CLINDAMYCIN PHOSPHATE 900 MG/50ML IV SOLN
900.0000 mg | INTRAVENOUS | Status: AC
  Administered 2020-02-14: 900 mg via INTRAVENOUS

## 2020-02-14 ENCOUNTER — Ambulatory Visit
Admission: RE | Admit: 2020-02-14 | Discharge: 2020-02-14 | Disposition: A | Discharge status: Home / Self Care | Payer: Medicare Other | Source: Ambulatory Visit | Attending: General Surgery | Admitting: General Surgery

## 2020-02-14 ENCOUNTER — Ambulatory Visit: Payer: Medicare Other | Admitting: Anesthesiology

## 2020-02-14 ENCOUNTER — Encounter
Admission: RE | Disposition: A | Discharge status: Home / Self Care | Payer: Self-pay | Source: Ambulatory Visit | Attending: General Surgery

## 2020-02-14 ENCOUNTER — Other Ambulatory Visit: Payer: Self-pay

## 2020-02-14 DIAGNOSIS — D0511 Intraductal carcinoma in situ of right breast: Secondary | ICD-10-CM

## 2020-02-14 DIAGNOSIS — L570 Actinic keratosis: Secondary | ICD-10-CM | POA: Diagnosis not present

## 2020-02-14 DIAGNOSIS — M109 Gout, unspecified: Secondary | ICD-10-CM | POA: Diagnosis not present

## 2020-02-14 DIAGNOSIS — Z7982 Long term (current) use of aspirin: Secondary | ICD-10-CM | POA: Diagnosis not present

## 2020-02-14 DIAGNOSIS — F17200 Nicotine dependence, unspecified, uncomplicated: Secondary | ICD-10-CM | POA: Insufficient documentation

## 2020-02-14 DIAGNOSIS — Z8541 Personal history of malignant neoplasm of cervix uteri: Secondary | ICD-10-CM | POA: Insufficient documentation

## 2020-02-14 DIAGNOSIS — Z79899 Other long term (current) drug therapy: Secondary | ICD-10-CM | POA: Insufficient documentation

## 2020-02-14 DIAGNOSIS — I10 Essential (primary) hypertension: Secondary | ICD-10-CM | POA: Diagnosis not present

## 2020-02-14 DIAGNOSIS — E7800 Pure hypercholesterolemia, unspecified: Secondary | ICD-10-CM | POA: Insufficient documentation

## 2020-02-14 HISTORY — PX: PARTIAL MASTECTOMY WITH NEEDLE LOCALIZATION AND AXILLARY SENTINEL LYMPH NODE BX: SHX6009

## 2020-02-14 HISTORY — PX: LESION REMOVAL: SHX5196

## 2020-02-14 HISTORY — PX: BREAST LUMPECTOMY: SHX2

## 2020-02-14 SURGERY — PARTIAL MASTECTOMY WITH NEEDLE LOCALIZATION AND AXILLARY SENTINEL LYMPH NODE BX
Anesthesia: General | Site: Leg Lower | Laterality: Right

## 2020-02-14 MED ORDER — TECHNETIUM TC 99M SULFUR COLLOID FILTERED
0.6760 | Freq: Once | INTRAVENOUS | Status: AC | PRN
  Administered 2020-02-14: 0.676 via INTRADERMAL

## 2020-02-14 MED ORDER — ONDANSETRON HCL 4 MG/2ML IJ SOLN
INTRAMUSCULAR | Status: AC
  Filled 2020-02-14: qty 2

## 2020-02-14 MED ORDER — FENTANYL CITRATE (PF) 100 MCG/2ML IJ SOLN
INTRAMUSCULAR | Status: AC
  Filled 2020-02-14: qty 2

## 2020-02-14 MED ORDER — PROMETHAZINE HCL 25 MG/ML IJ SOLN: 6.2500 mg | INTRAMUSCULAR | Status: DC | PRN

## 2020-02-14 MED ORDER — FENTANYL CITRATE (PF) 100 MCG/2ML IJ SOLN
INTRAMUSCULAR | Status: AC
  Administered 2020-02-14: 25 ug via INTRAVENOUS
  Filled 2020-02-14: qty 2

## 2020-02-14 MED ORDER — GLYCOPYRROLATE 0.2 MG/ML IJ SOLN
INTRAMUSCULAR | Status: AC
  Filled 2020-02-14: qty 1

## 2020-02-14 MED ORDER — OXYCODONE HCL 5 MG/5ML PO SOLN: 5.0000 mg | Freq: Once | ORAL | Status: AC | PRN

## 2020-02-14 MED ORDER — LIDOCAINE HCL (PF) 2 % IJ SOLN
INTRAMUSCULAR | Status: AC
  Filled 2020-02-14: qty 5

## 2020-02-14 MED ORDER — ACETAMINOPHEN 10 MG/ML IV SOLN
INTRAVENOUS | Status: AC
  Filled 2020-02-14: qty 100

## 2020-02-14 MED ORDER — GLYCOPYRROLATE 0.2 MG/ML IJ SOLN
INTRAMUSCULAR | Status: DC | PRN
  Administered 2020-02-14: .2 mg via INTRAVENOUS

## 2020-02-14 MED ORDER — MIDAZOLAM HCL 2 MG/2ML IJ SOLN
INTRAMUSCULAR | Status: AC
  Filled 2020-02-14: qty 2

## 2020-02-14 MED ORDER — DEXAMETHASONE SODIUM PHOSPHATE 10 MG/ML IJ SOLN
INTRAMUSCULAR | Status: AC
  Filled 2020-02-14: qty 1

## 2020-02-14 MED ORDER — OXYCODONE HCL 5 MG PO TABS
ORAL_TABLET | ORAL | Status: AC
  Administered 2020-02-14: 16:00:00 5 mg via ORAL
  Filled 2020-02-14: qty 1

## 2020-02-14 MED ORDER — METHYLENE BLUE 0.5 % INJ SOLN
INTRAVENOUS | Status: DC | PRN
  Administered 2020-02-14: 3 mL via SUBMUCOSAL

## 2020-02-14 MED ORDER — OXYCODONE HCL 5 MG PO TABS: 5.0000 mg | ORAL_TABLET | Freq: Once | ORAL | Status: AC | PRN

## 2020-02-14 MED ORDER — BUPIVACAINE-EPINEPHRINE 0.5% -1:200000 IJ SOLN
INTRAMUSCULAR | Status: DC | PRN
  Administered 2020-02-14: 10 mL

## 2020-02-14 MED ORDER — MEPERIDINE HCL 50 MG/ML IJ SOLN: 6.2500 mg | INTRAMUSCULAR | Status: DC | PRN

## 2020-02-14 MED ORDER — PHENYLEPHRINE HCL (PRESSORS) 10 MG/ML IV SOLN
INTRAVENOUS | Status: DC | PRN
  Administered 2020-02-14 (×3): 100 ug via INTRAVENOUS

## 2020-02-14 MED ORDER — HYDROCODONE-ACETAMINOPHEN 5-325 MG PO TABS: 1.0000 | ORAL_TABLET | ORAL | 0 refills | Status: AC | PRN

## 2020-02-14 MED ORDER — FENTANYL CITRATE (PF) 100 MCG/2ML IJ SOLN
INTRAMUSCULAR | Status: DC | PRN
  Administered 2020-02-14: 50 ug via INTRAVENOUS
  Administered 2020-02-14 (×2): 25 ug via INTRAVENOUS

## 2020-02-14 MED ORDER — FENTANYL CITRATE (PF) 100 MCG/2ML IJ SOLN
25.0000 ug | INTRAMUSCULAR | Status: AC | PRN
  Administered 2020-02-14 (×4): 25 ug via INTRAVENOUS

## 2020-02-14 MED ORDER — BUPIVACAINE-EPINEPHRINE (PF) 0.5% -1:200000 IJ SOLN
INTRAMUSCULAR | Status: AC
  Filled 2020-02-14: qty 30

## 2020-02-14 MED ORDER — ACETAMINOPHEN 10 MG/ML IV SOLN
INTRAVENOUS | Status: DC | PRN
  Administered 2020-02-14: 1000 mg via INTRAVENOUS

## 2020-02-14 MED ORDER — ONDANSETRON HCL 4 MG/2ML IJ SOLN
INTRAMUSCULAR | Status: DC | PRN
  Administered 2020-02-14: 4 mg via INTRAVENOUS

## 2020-02-14 MED ORDER — CLINDAMYCIN PHOSPHATE 900 MG/50ML IV SOLN
INTRAVENOUS | Status: AC
  Filled 2020-02-14: qty 50

## 2020-02-14 MED ORDER — MIDAZOLAM HCL 2 MG/2ML IJ SOLN
INTRAMUSCULAR | Status: DC | PRN
  Administered 2020-02-14: 2 mg via INTRAVENOUS

## 2020-02-14 MED ORDER — SEVOFLURANE IN SOLN
RESPIRATORY_TRACT | Status: AC
  Filled 2020-02-14: qty 250

## 2020-02-14 MED ORDER — PROPOFOL 10 MG/ML IV BOLUS
INTRAVENOUS | Status: DC | PRN
  Administered 2020-02-14: 150 mg via INTRAVENOUS

## 2020-02-14 MED ORDER — DEXAMETHASONE SODIUM PHOSPHATE 10 MG/ML IJ SOLN
INTRAMUSCULAR | Status: DC | PRN
  Administered 2020-02-14: 10 mg via INTRAVENOUS

## 2020-02-14 MED ORDER — LIDOCAINE HCL (CARDIAC) PF 100 MG/5ML IV SOSY
PREFILLED_SYRINGE | INTRAVENOUS | Status: DC | PRN
  Administered 2020-02-14: 100 mg via INTRAVENOUS

## 2020-02-14 MED ORDER — PROPOFOL 10 MG/ML IV BOLUS
INTRAVENOUS | Status: AC
  Filled 2020-02-14: qty 20

## 2020-02-14 MED ORDER — LACTATED RINGERS IV SOLN: INTRAVENOUS | Status: DC

## 2020-02-14 MED ORDER — METHYLENE BLUE 0.5 % INJ SOLN
INTRAVENOUS | Status: AC
  Filled 2020-02-14: qty 10

## 2020-02-14 SURGICAL SUPPLY — 57 items
BINDER BREAST LRG (GAUZE/BANDAGES/DRESSINGS) IMPLANT
BINDER BREAST MEDIUM (GAUZE/BANDAGES/DRESSINGS) IMPLANT
BINDER BREAST XLRG (GAUZE/BANDAGES/DRESSINGS) ×4 IMPLANT
BINDER BREAST XXLRG (GAUZE/BANDAGES/DRESSINGS) IMPLANT
BLADE SURG 15 STRL LF DISP TIS (BLADE) ×4 IMPLANT
BLADE SURG 15 STRL SS (BLADE) ×4
CANISTER SUCT 1200ML W/VALVE (MISCELLANEOUS) ×4 IMPLANT
CHLORAPREP W/TINT 26 (MISCELLANEOUS) ×8 IMPLANT
CLOSURE WOUND 1/4X4 (GAUZE/BANDAGES/DRESSINGS) ×1
CNTNR SPEC 2.5X3XGRAD LEK (MISCELLANEOUS) ×2
CONT SPEC 4OZ STER OR WHT (MISCELLANEOUS) ×2
CONTAINER SPEC 2.5X3XGRAD LEK (MISCELLANEOUS) ×2 IMPLANT
COVER WAND RF STERILE (DRAPES) ×4 IMPLANT
DERMABOND ADVANCED (GAUZE/BANDAGES/DRESSINGS) ×2
DERMABOND ADVANCED .7 DNX12 (GAUZE/BANDAGES/DRESSINGS) ×2 IMPLANT
DEVICE DUBIN SPECIMEN MAMMOGRA (MISCELLANEOUS) ×4 IMPLANT
DRAPE LAPAROTOMY 100X77 ABD (DRAPES) ×4 IMPLANT
DRAPE LAPAROTOMY TRNSV 106X77 (MISCELLANEOUS) ×4 IMPLANT
DRAPE UNDER BUTTOCK W/FLU (DRAPES) IMPLANT
DRSG GAUZE FLUFF 36X18 (GAUZE/BANDAGES/DRESSINGS) IMPLANT
ELECT CAUTERY BLADE 6.4 (BLADE) ×4 IMPLANT
ELECT REM PT RETURN 9FT ADLT (ELECTROSURGICAL) ×4
ELECTRODE REM PT RTRN 9FT ADLT (ELECTROSURGICAL) ×2 IMPLANT
GLOVE BIO SURGEON STRL SZ 6.5 (GLOVE) ×9 IMPLANT
GLOVE BIO SURGEONS STRL SZ 6.5 (GLOVE) ×3
GLOVE BIOGEL PI IND STRL 6.5 (GLOVE) ×8 IMPLANT
GLOVE BIOGEL PI INDICATOR 6.5 (GLOVE) ×8
GOWN STRL REUS W/ TWL LRG LVL3 (GOWN DISPOSABLE) ×6 IMPLANT
GOWN STRL REUS W/TWL LRG LVL3 (GOWN DISPOSABLE) ×6
KIT MARKER MARGIN INK (KITS) ×4 IMPLANT
KIT TURNOVER KIT A (KITS) ×4 IMPLANT
LABEL OR SOLS (LABEL) ×4 IMPLANT
MARGIN MAP 10MM (MISCELLANEOUS) IMPLANT
MARKER MARGIN CORRECT CLIP (MARKER) ×4 IMPLANT
NEEDLE HYPO 22GX1.5 SAFETY (NEEDLE) ×4 IMPLANT
NEEDLE HYPO 25X1 1.5 SAFETY (NEEDLE) ×4 IMPLANT
NS IRRIG 500ML POUR BTL (IV SOLUTION) ×4 IMPLANT
PACK BASIN MINOR ARMC (MISCELLANEOUS) ×4 IMPLANT
PUNCH BIOPSY DERMAL 6MM STRL (MISCELLANEOUS) ×4 IMPLANT
RETRACTOR RING XSMALL (MISCELLANEOUS) ×2 IMPLANT
RTRCTR WOUND ALEXIS 13CM XS SH (MISCELLANEOUS) ×4
SLEVE PROBE SENORX GAMMA FIND (MISCELLANEOUS) ×4 IMPLANT
STRIP CLOSURE SKIN 1/4X4 (GAUZE/BANDAGES/DRESSINGS) ×3 IMPLANT
SUT ETHILON 3-0 (SUTURE) ×4 IMPLANT
SUT ETHILON 3-0 FS-10 30 BLK (SUTURE)
SUT MNCRL 4-0 (SUTURE) ×4
SUT MNCRL 4-0 27XMFL (SUTURE) ×4
SUT SILK 2 0 SH (SUTURE) ×4 IMPLANT
SUT VIC AB 2-0 SH 27 (SUTURE) ×2
SUT VIC AB 2-0 SH 27XBRD (SUTURE) ×2 IMPLANT
SUT VIC AB 3-0 SH 27 (SUTURE) ×4
SUT VIC AB 3-0 SH 27X BRD (SUTURE) ×4 IMPLANT
SUTURE EHLN 3-0 FS-10 30 BLK (SUTURE) IMPLANT
SUTURE MNCRL 4-0 27XMF (SUTURE) ×4 IMPLANT
SYR 10ML LL (SYRINGE) ×8 IMPLANT
SYR BULB IRRIG 60ML STRL (SYRINGE) ×4 IMPLANT
WATER STERILE IRR 1000ML POUR (IV SOLUTION) ×4 IMPLANT

## 2020-02-14 NOTE — Anesthesia Procedure Notes (Addendum)
Procedure Name: LMA Insertion Date/Time: 02/14/2020 12:14 PM Performed by: Doreen Salvage, CRNA Pre-anesthesia Checklist: Patient identified, Patient being monitored, Timeout performed, Emergency Drugs available and Suction available Patient Re-evaluated:Patient Re-evaluated prior to induction Oxygen Delivery Method: Circle system utilized Preoxygenation: Pre-oxygenation with 100% oxygen Induction Type: IV induction Ventilation: Mask ventilation without difficulty LMA: LMA inserted LMA Size: 4.0 Tube type: Oral Number of attempts: 1 Placement Confirmation: positive ETCO2 and breath sounds checked- equal and bilateral Tube secured with: Tape Dental Injury: Teeth and Oropharynx as per pre-operative assessment

## 2020-02-14 NOTE — Op Note (Addendum)
Preoperative diagnosis: Right breast carcinoma.                                            Left leg squamous cell carcionoma  Postoperative diagnosis: Right breast carcinoma.              Left leg squamous cell carcionoma   Procedure: Right needle-localized partial mastectomy.                       Right Axillary Sentinel Lymph node biopsy                      Left leg excision of Squamous cell carcinoma  Anesthesia: GETA  Surgeon: Dr. Windell Moment  Wound Classification: Clean  Indications: Patient is a 74 y.o. female with a nonpalpable right breast mass noted on mammography with core biopsy demonstrating DCIS requires needle-localized partial mastectomy for treatment with sentinel lymph node biopsy.   Findings: 1. Specimen mammography shows marker and wire on specimen 2. Pathology call refers gross examination of margins was unable to evaluate margins since there was no mass 3. No other palpable mass or lymph node identified.   Description of procedure: Preoperative needle localization was performed by radiology. In the nuclear medicine suite, the subareolar region was injected with Tc-99 sulfur colloid. Localization studies were reviewed. The patient was taken to the operating room and placed supine on the operating table, and after general anesthesia the right chest and axilla were prepped and draped in the usual sterile fashion. A time-out was completed verifying correct patient, procedure, site, positioning, and implant(s) and/or special equipment prior to beginning this procedure. I personally injected blue dye on the subdermal tissue around the right breast nipple areola complex for lymph node mapping.  By comparing the localization studies with the direction and skin entry site of the needle and avoiding the Hologic localizer, the probable trajectory and location of the mass was visualized. A circumareolar skin incision was planned in such a way as to minimize the amount of dissection  to reach the mass.  The skin incision was made. Flaps were raised and the location of the wire confirmed. The wire was delivered into the wound. A 2-0 silk figure-of-eight stay suture was placed around the wire and used for retraction. Dissection was then taken down circumferentially, taking care to include the entire localizing needle and a wide margin of grossly normal tissue. The specimen and entire localizing wire were removed. The specimen was oriented and sent to radiology with the localization studies. Confirmation was received that the entire target lesion had been resected. The wound was irrigated. Hemostasis was checked. The wound was closed with interrupted sutures of 3-0 Vicryl and a subcuticular suture of Monocryl 3-0. No attempt was made to close the dead space. A dressing was applied.   A hand-held gamma probe was used to identify the location of the hottest spot in the axilla. An incision was made around the caudal axillary hairline. Dissection was carried down until subdermal facias was advanced. The probe was placed and again, the point of maximal count was found. Dissection continue until nodule was identified. The node was excised in its entirety. Ex vivo, the node measured 11562 counts when placed on the probe.  An additional hot spot was detected and the node was excised in similar fashion. The following  counts registered: [id number] ex vivo node, and 2 residual bed. No additional hot spots were identified. No clinically abnormal nodes were palpated. The procedure was terminated. Hemostasis was achieved and the wound closed in layers with deep interrupted 3-0 Vicryl and skin was closed with subcuticular suture of Monocryl 3-0.  The patient tolerated the procedure well and was taken to the postanesthesia care unit in stable condition.   An elliptical incision was done around the left leg skin lesion.  Dissection was carried down to fat layer.  Complete excision of the lesion was achieved. The  skin lesion measured 2 cm.  Hemostasis was achieved.  The skin was closed with 4-0 Monocryl.  Wound was covered with Steri-Strips.  Specimen sent to pathology.  Specimen: Right Breast mass                     Sentinel Lymph nodes (2)                    Left leg lesion  Complications: None  Estimated Blood Loss: 25 mL

## 2020-02-14 NOTE — Transfer of Care (Signed)
Immediate Anesthesia Transfer of Care Note  Patient: Deanna Grant  Procedure(s) Performed: Procedure(s): PARTIAL MASTECTOMY WITH NEEDLE LOCALIZATION AND AXILLARY SENTINEL LYMPH NODE BX (Right) LESION REMOVAL (Left)  Patient Location: PACU  Anesthesia Type:General  Level of Consciousness: sedated  Airway & Oxygen Therapy: Patient Spontanous Breathing and Patient connected to face mask oxygen  Post-op Assessment: Report given to RN and Post -op Vital signs reviewed and stable  Post vital signs: Reviewed and stable  Last Vitals:  Vitals:   02/14/20 0951 02/14/20 1438  BP: (!) 149/70 (!) 113/55  Pulse: 69 76  Resp: 20 14  Temp: (!) 36.4 C (P) 36.7 C  SpO2: 123XX123 [id number]    Complications: No apparent anesthesia complications

## 2020-02-14 NOTE — Interval H&P Note (Signed)
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  The various methods of treatment have been discussed with the patient and family. After consideration of risks, benefits and other options for treatment, the patient has consented to  Procedure(s): PARTIAL MASTECTOMY WITH NEEDLE LOCALIZATION AND AXILLARY SENTINEL LYMPH NODE BX (Right) LESION REMOVAL (right) as a surgical intervention.  The patient's history has been reviewed, patient examined, no change in status, stable for surgery.  I have reviewed the patient's chart and labs.  Right breast marked in the pre procedure room. Questions were answered to the patient's satisfaction.     Herbert Pun

## 2020-02-14 NOTE — Discharge Instructions (Addendum)
Oxycodone 5mg  taken March 5 at 4:05pm   Diet: Resume home heart healthy regular diet.   Activity: No heavy lifting >20 pounds (children, pets, laundry, garbage) or strenuous activity for one week, but light activity and walking are encouraged. Do not drive or drink alcohol if taking narcotic pain medications.  Wound care: May shower with soapy water and pat dry (do not rub incisions), but no baths or submerging incision underwater until follow-up. (no swimming)   Medications: Resume all home medications. For mild to moderate pain: acetaminophen (Tylenol) or ibuprofen (if no kidney disease). Combining Tylenol with alcohol can substantially increase your risk of causing liver disease. Narcotic pain medications, if prescribed, can be used for severe pain, though may cause nausea, constipation, and drowsiness. Do not combine Tylenol and Norco within a 6 hour period as Norco contains Tylenol. If you do not need the narcotic pain medication, you do not need to fill the prescription.  Call office [phone]) at any time if any questions, worsening pain, fevers/chills, bleeding, drainage from incision site, or other concerns.    AMBULATORY SURGERY  DISCHARGE INSTRUCTIONS   1) The drugs that you were given will stay in your system until tomorrow so for the next 24 hours you should not:  A) Drive an automobile B) Make any legal decisions C) Drink any alcoholic beverage   2) You may resume regular meals tomorrow.  Today it is better to start with liquids and gradually work up to solid foods.  You may eat anything you prefer, but it is better to start with liquids, then soup and crackers, and gradually work up to solid foods.   3) Please notify your doctor immediately if you have any unusual bleeding, trouble breathing, redness and pain at the surgery site, drainage, fever, or pain not relieved by medication.    4) Additional Instructions:        Please contact your physician with  any problems or Same Day Surgery at [phone], Monday through Friday 6 am to 4 pm, or [place] at [facility] number at [phone].

## 2020-02-14 NOTE — Anesthesia Preprocedure Evaluation (Signed)
Anesthesia Evaluation  Patient identified by MRN, date of birth, ID band Patient awake    Reviewed: Allergy & Precautions, NPO status , Patient's Chart, lab work & pertinent test results  History of Anesthesia Complications Negative for: history of anesthetic complications  Airway Mallampati: II  TM Distance: >3 FB Neck ROM: Full    Dental  (+) Upper Dentures   Pulmonary neg COPD, Current Smoker and Patient abstained from smoking.,    breath sounds clear to auscultation- rhonchi (-) wheezing      Cardiovascular hypertension, Pt. on medications (-) CAD, (-) Past MI, (-) Cardiac Stents and (-) CABG  Rhythm:Regular Rate:Normal - Systolic murmurs and - Diastolic murmurs    Neuro/Psych neg Seizures negative neurological ROS  negative psych ROS   GI/Hepatic negative GI ROS, Neg liver ROS,   Endo/Other  negative endocrine ROSneg diabetes  Renal/GU negative Renal ROS     Musculoskeletal  (+) Arthritis ,   Abdominal (+) + obese,   Peds  Hematology negative hematology ROS (+)   Anesthesia Other Findings Past Medical History: No date: Angina at rest [facility]) 2010: Arthritis 1977: Cancer ([place])     Comment:  cervical No date: Degenerative disc disease, lumbar No date: Heart murmur 2014: Hypertension   Reproductive/Obstetrics                             Anesthesia Physical Anesthesia Plan  ASA: II  Anesthesia Plan: General   Post-op Pain Management:    Induction: Intravenous  PONV Risk Score and Plan: 1 and Ondansetron and Dexamethasone  Airway Management Planned: LMA  Additional Equipment:   Intra-op Plan:   Post-operative Plan:   Informed Consent: I have reviewed the patients History and Physical, chart, labs and discussed the procedure including the risks, benefits and alternatives for the proposed anesthesia with the patient or authorized representative who has indicated his/her  understanding and acceptance.     Dental advisory given  Plan Discussed with: CRNA and Anesthesiologist  Anesthesia Plan Comments:         Anesthesia Quick Evaluation

## 2020-02-17 NOTE — Anesthesia Postprocedure Evaluation (Signed)
Anesthesia Post Note  Patient: Deanna Grant  Procedure(s) Performed: PARTIAL MASTECTOMY WITH NEEDLE LOCALIZATION AND AXILLARY SENTINEL LYMPH NODE BX (Right Breast) LESION REMOVAL (Left Leg Lower)  Patient location during evaluation: PACU Anesthesia Type: General Level of consciousness: awake and alert Pain management: pain level controlled Vital Signs Assessment: post-procedure vital signs reviewed and stable Respiratory status: spontaneous breathing and respiratory function stable Cardiovascular status: stable Anesthetic complications: no     Last Vitals:  Vitals:   02/14/20 1634 02/14/20 1647  BP: (!) 148/70 (!) 148/75  Pulse: 60 68  Resp: 16 16  Temp: (!) 36.2 C   SpO2: 98% 99%    Last Pain:  Vitals:   02/14/20 1647  TempSrc:   PainSc: 5                  Adna Nofziger K

## 2020-02-20 LAB — SURGICAL PATHOLOGY

## 2020-03-02 ENCOUNTER — Encounter: Payer: Self-pay | Admitting: Radiation Oncology

## 2020-03-02 ENCOUNTER — Other Ambulatory Visit: Payer: Self-pay

## 2020-03-03 ENCOUNTER — Ambulatory Visit
Admission: RE | Admit: 2020-03-03 | Discharge: 2020-03-03 | Disposition: A | Discharge status: Home / Self Care | Payer: Medicare Other | Source: Ambulatory Visit | Attending: Radiation Oncology | Admitting: Radiation Oncology

## 2020-03-03 VITALS — BP 160/89 | HR 79 | Temp 96.9°F | Resp 16 | Wt 165.3 lb

## 2020-03-03 DIAGNOSIS — M5136 Other intervertebral disc degeneration, lumbar region: Secondary | ICD-10-CM | POA: Insufficient documentation

## 2020-03-03 DIAGNOSIS — Z171 Estrogen receptor negative status [ER-]: Secondary | ICD-10-CM | POA: Insufficient documentation

## 2020-03-03 DIAGNOSIS — Z79899 Other long term (current) drug therapy: Secondary | ICD-10-CM | POA: Diagnosis not present

## 2020-03-03 DIAGNOSIS — R011 Cardiac murmur, unspecified: Secondary | ICD-10-CM | POA: Diagnosis not present

## 2020-03-03 DIAGNOSIS — D0511 Intraductal carcinoma in situ of right breast: Secondary | ICD-10-CM

## 2020-03-03 DIAGNOSIS — M129 Arthropathy, unspecified: Secondary | ICD-10-CM | POA: Insufficient documentation

## 2020-03-03 DIAGNOSIS — F17210 Nicotine dependence, cigarettes, uncomplicated: Secondary | ICD-10-CM | POA: Diagnosis not present

## 2020-03-03 DIAGNOSIS — I10 Essential (primary) hypertension: Secondary | ICD-10-CM | POA: Insufficient documentation

## 2020-03-03 NOTE — Consult Note (Signed)
NEW PATIENT EVALUATION  Name: Deanna Grant  MRN: [id number]  Date:   03/03/2020     DOB: 09/16/1946   This 74 y.o. 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  REFERRING PHYSICIAN: Tracie Harrier, MD  CHIEF COMPLAINT:  Chief Complaint  Patient presents with  . Breast Cancer    Initial consultation    DIAGNOSIS: The encounter diagnosis was Ductal carcinoma in situ (DCIS) of right breast.   PREVIOUS INVESTIGATIONS:  Mammogram ultrasound and MRI scan reviewed Pathology report reviewed Clinical notes reviewed  HPI: Patient is a 74 year old female whose husband was a former patient of mine approximately 10 years prior who presented with an abnormal mammogram of the right breast showing an area of 4 cm suspicious with calcifications and associated asymmetry.  Stereotactic biopsy was positive for ER negative ductal carcinoma in situ high-grade with comedonecrosis.  MRI of the right breast showed central and upper right breast area measuring 5 x 2.5 x 6 cm of biopsy-proven DCIS.  No abnormal lymph nodes were noted.  Patient was taken to the OR for a wide local excision.  There was least a 23 mm area excised of ductal carcinoma in situ high-grade 3 with expansive comedonecrosis.  Margins were clear but close to 0.5 mm.  1 sentinel lymph node was negative for metastatic disease.  I have spoken to surgeon who stated the wide local excision was difficult and he is not confident on reexcision he could obtain a clear margin.  She also had postoperative complications of significant swelling of the breast as well as multiple areas of blistering of the skin secondary to possible radiotracer being used.  She is still in significant pain.  She is accompanied by her daughter today aside from the breast tenderness and healing areas of skin irritation  she otherwise is doing well.  She is seen today for radiation oncology consultation.  PLANNED TREATMENT REGIMEN: Right whole breast radiation with significant electron scar boost based on the close margin  PAST MEDICAL HISTORY:  has a past medical history of Angina at rest [facility]), Arthritis (2010), Cancer ([place]) (1977), Degenerative disc disease, lumbar, Heart murmur, and Hypertension (2014).    PAST SURGICAL HISTORY:  Past Surgical History:  Procedure Laterality Date  . ABDOMINAL HYSTERECTOMY  1977  . BREAST BIOPSY Right 2014   stereo, neg  . BREAST BIOPSY Right 01/22/2020   2 area bx coil and ribbon both high grade DCIS  . BREAST LUMPECTOMY Right 02/14/2020   Wire/tag placement   . COLONOSCOPY  2002  . COLONOSCOPY WITH PROPOFOL N/A 12/14/2017   Procedure: COLONOSCOPY WITH PROPOFOL;  Surgeon: Lollie Sails, MD;  Location: [facility] ENDOSCOPY;  Service: Endoscopy;  Laterality: N/A;  . COLONOSCOPY WITH PROPOFOL N/A 12/15/2017   Procedure: COLONOSCOPY WITH PROPOFOL;  Surgeon: Lollie Sails, MD;  Location: [facility] ENDOSCOPY;  Service: Endoscopy;  Laterality: N/A;  . LESION REMOVAL Left 02/14/2020   Procedure: LESION REMOVAL;  Surgeon: Herbert Pun, MD;  Location: [facility] ORS;  Service: General;  Laterality: Left;  . PARTIAL MASTECTOMY WITH NEEDLE LOCALIZATION AND AXILLARY SENTINEL LYMPH NODE BX Right 02/14/2020   Procedure: PARTIAL MASTECTOMY WITH NEEDLE LOCALIZATION AND AXILLARY SENTINEL LYMPH NODE BX;  Surgeon: Herbert Pun, MD;  Location: [facility] ORS;  Service: General;  Laterality: Right;    FAMILY HISTORY: family history includes Colon  cancer in her father; Lung cancer in her cousin; Pancreatic cancer in her brother.  SOCIAL HISTORY:  reports that she has been smoking. She has a 15.00 pack-year smoking history. She has never used smokeless tobacco. She reports that she does not drink alcohol or use drugs.  ALLERGIES: Asa [aspirin] and Penicillins  MEDICATIONS:  Current  Outpatient Medications  Medication Sig Dispense Refill  . acetaminophen (TYLENOL) 500 MG tablet Take 1,000 mg by mouth every 6 (six) hours as needed for moderate pain.    Marland Kitchen allopurinol (ZYLOPRIM) 100 MG tablet Take 100 mg by mouth at bedtime.    . ALPRAZolam (XANAX) 0.25 MG tablet Take 0.25 mg by mouth daily as needed.    Marland Kitchen amLODipine (NORVASC) 2.5 MG tablet Take 2.5 mg by mouth at bedtime.    . cholecalciferol (VITAMIN D) 25 MCG (1000 UNIT) tablet Take 1,000 Units by mouth daily.     . Cyanocobalamin (B-12) 2500 MCG TABS Take 2,500 mcg by mouth daily.    . cyclobenzaprine (FLEXERIL) 5 MG tablet Take 1 tablet (5 mg total) by mouth 3 (three) times daily as needed for muscle spasms. 15 tablet 0  . olmesartan (BENICAR) 40 MG tablet Take 40 mg by mouth daily.    Marland Kitchen omeprazole (PRILOSEC) 20 MG capsule Take 20 mg by mouth daily.    . silver sulfADIAZINE (SILVADENE) 1 % cream Apply 1 application topically 2 (two) times daily.    . simvastatin (ZOCOR) 20 MG tablet Take 20 mg by mouth at bedtime.      No current facility-administered medications for this encounter.    ECOG PERFORMANCE STATUS:  1 - Symptomatic but completely ambulatory  REVIEW OF SYSTEMS: Patient denies any weight loss, fatigue, weakness, fever, chills or night sweats. Patient denies any loss of vision, blurred vision. Patient denies any ringing  of the ears or hearing loss. No irregular heartbeat. Patient denies heart murmur or history of fainting. Patient denies any chest pain or pain radiating to her upper extremities. Patient denies any shortness of breath, difficulty breathing at night, cough or hemoptysis. Patient denies any swelling in the lower legs. Patient denies any nausea vomiting, vomiting of blood, or coffee ground material in the vomitus. Patient denies any stomach pain. Patient states has had normal bowel movements no significant constipation or diarrhea. Patient denies any dysuria, hematuria or significant nocturia. Patient  denies any problems walking, swelling in the joints or loss of balance. Patient denies any skin changes, loss of hair or loss of weight. Patient denies any excessive worrying or anxiety or significant depression. Patient denies any problems with insomnia. Patient denies excessive thirst, polyuria, polydipsia. Patient denies any swollen glands, patient denies easy bruising or easy bleeding. Patient denies any recent infections, allergies or URI. Patient "s visual fields have not changed significantly in recent time.   PHYSICAL EXAM: BP (!) 160/89   Pulse 79   Temp (!) 96.9 F (36.1 C)   Resp 16   Wt 165 lb 4.8 oz (75 kg)   SpO2 99%   BMI 32.28 kg/m  Right breast is inflamed.  There are skin welts covering a significant port of the breast.  No dominant mass or nodularity is noted in either breast in 2 positions examined.  No axillary or supraclavicular adenopathy is appreciated.  Well-developed well-nourished patient in NAD. HEENT reveals PERLA, EOMI, discs not visualized.  Oral cavity is clear. No oral mucosal lesions are identified. Neck is clear without evidence of cervical or supraclavicular adenopathy. Lungs  are clear to A&P. Cardiac examination is essentially unremarkable with regular rate and rhythm without murmur rub or thrill. Abdomen is benign with no organomegaly or masses noted. Motor sensory and DTR levels are equal and symmetric in the upper and lower extremities. Cranial nerves II through XII are grossly intact. Proprioception is intact. No peripheral adenopathy or edema is identified. No motor or sensory levels are noted. Crude visual fields are within normal range.  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  However the side effects of her surgery including significant skin reaction and blistering as well as significant pain associated with her healing breast patient and daughter are declining any further surgery.  I believe that this is ductal carcinoma in situ and with whole breast radiation at 5040 cGy in 28 fractions plus a 1600 centigrade electron scar boost we would be able to decrease significantly the chance of ipsilateral breast recurrence.  Certainly this being DCIS this would not affect her overall survival.  If the patient and her daughters wishes no further surgery i.e. reexcision be performed and go ahead to radiation therapy.  Her breast is rather large and pendulous making hypofractionated course of treatment difficult.  Risks and benefits of r radiation including skin reaction fatigue alteration of blood counts possible occlusion of superficial lung all were discussed in detail with the patient.  I have personally set up and ordered CT simulation about a week and a half to allow further healing of the breast.  Patient and daughter both comprehend my treatment plan well.  I discussed the case personally with surgeon.  We both agree this is the best course of treatment at this time.  I would like to take this opportunity to thank you for allowing me to participate in the care of your patient.Noreene Filbert, MD

## 2020-03-10 ENCOUNTER — Other Ambulatory Visit: Payer: Self-pay

## 2020-03-12 ENCOUNTER — Ambulatory Visit
Admission: RE | Admit: 2020-03-12 | Discharge: 2020-03-12 | Disposition: A | Discharge status: Home / Self Care | Payer: Medicare Other | Source: Ambulatory Visit | Attending: Radiation Oncology | Admitting: Radiation Oncology

## 2020-03-12 DIAGNOSIS — R011 Cardiac murmur, unspecified: Secondary | ICD-10-CM | POA: Insufficient documentation

## 2020-03-12 DIAGNOSIS — D0511 Intraductal carcinoma in situ of right breast: Secondary | ICD-10-CM | POA: Diagnosis present

## 2020-03-12 DIAGNOSIS — Z171 Estrogen receptor negative status [ER-]: Secondary | ICD-10-CM | POA: Insufficient documentation

## 2020-03-12 DIAGNOSIS — Z79899 Other long term (current) drug therapy: Secondary | ICD-10-CM | POA: Insufficient documentation

## 2020-03-12 DIAGNOSIS — I10 Essential (primary) hypertension: Secondary | ICD-10-CM | POA: Insufficient documentation

## 2020-03-12 DIAGNOSIS — M5136 Other intervertebral disc degeneration, lumbar region: Secondary | ICD-10-CM | POA: Diagnosis not present

## 2020-03-12 DIAGNOSIS — M129 Arthropathy, unspecified: Secondary | ICD-10-CM | POA: Diagnosis not present

## 2020-03-12 DIAGNOSIS — F17210 Nicotine dependence, cigarettes, uncomplicated: Secondary | ICD-10-CM | POA: Diagnosis not present

## 2020-03-13 DIAGNOSIS — D0511 Intraductal carcinoma in situ of right breast: Secondary | ICD-10-CM | POA: Diagnosis not present

## 2020-03-16 ENCOUNTER — Other Ambulatory Visit: Payer: Self-pay | Admitting: *Deleted

## 2020-03-16 DIAGNOSIS — D0511 Intraductal carcinoma in situ of right breast: Secondary | ICD-10-CM

## 2020-03-18 ENCOUNTER — Ambulatory Visit: Admission: RE | Admit: 2020-03-18 | Payer: Medicare Other | Source: Ambulatory Visit

## 2020-03-18 DIAGNOSIS — D0511 Intraductal carcinoma in situ of right breast: Secondary | ICD-10-CM | POA: Diagnosis not present

## 2020-03-19 ENCOUNTER — Ambulatory Visit
Admission: RE | Admit: 2020-03-19 | Discharge: 2020-03-19 | Disposition: A | Discharge status: Home / Self Care | Payer: Medicare Other | Source: Ambulatory Visit | Attending: Radiation Oncology | Admitting: Radiation Oncology

## 2020-03-19 DIAGNOSIS — D0511 Intraductal carcinoma in situ of right breast: Secondary | ICD-10-CM | POA: Diagnosis not present

## 2020-03-20 ENCOUNTER — Ambulatory Visit
Admission: RE | Admit: 2020-03-20 | Discharge: 2020-03-20 | Disposition: A | Discharge status: Home / Self Care | Payer: Medicare Other | Source: Ambulatory Visit | Attending: Radiation Oncology | Admitting: Radiation Oncology

## 2020-03-20 DIAGNOSIS — D0511 Intraductal carcinoma in situ of right breast: Secondary | ICD-10-CM | POA: Diagnosis not present

## 2020-03-23 ENCOUNTER — Ambulatory Visit
Admission: RE | Admit: 2020-03-23 | Discharge: 2020-03-23 | Disposition: A | Discharge status: Home / Self Care | Payer: Medicare Other | Source: Ambulatory Visit | Attending: Radiation Oncology | Admitting: Radiation Oncology

## 2020-03-23 DIAGNOSIS — D0511 Intraductal carcinoma in situ of right breast: Secondary | ICD-10-CM | POA: Diagnosis not present

## 2020-03-24 ENCOUNTER — Ambulatory Visit
Admission: RE | Admit: 2020-03-24 | Discharge: 2020-03-24 | Disposition: A | Discharge status: Home / Self Care | Payer: Medicare Other | Source: Ambulatory Visit | Attending: Radiation Oncology | Admitting: Radiation Oncology

## 2020-03-24 DIAGNOSIS — D0511 Intraductal carcinoma in situ of right breast: Secondary | ICD-10-CM | POA: Diagnosis not present

## 2020-03-25 ENCOUNTER — Ambulatory Visit
Admission: RE | Admit: 2020-03-25 | Discharge: 2020-03-25 | Disposition: A | Discharge status: Home / Self Care | Payer: Medicare Other | Source: Ambulatory Visit | Attending: Radiation Oncology | Admitting: Radiation Oncology

## 2020-03-25 DIAGNOSIS — D0511 Intraductal carcinoma in situ of right breast: Secondary | ICD-10-CM | POA: Diagnosis not present

## 2020-03-26 ENCOUNTER — Ambulatory Visit
Admission: RE | Admit: 2020-03-26 | Discharge: 2020-03-26 | Disposition: A | Discharge status: Home / Self Care | Payer: Medicare Other | Source: Ambulatory Visit | Attending: Radiation Oncology | Admitting: Radiation Oncology

## 2020-03-26 DIAGNOSIS — D0511 Intraductal carcinoma in situ of right breast: Secondary | ICD-10-CM | POA: Diagnosis not present

## 2020-03-27 ENCOUNTER — Ambulatory Visit
Admission: RE | Admit: 2020-03-27 | Discharge: 2020-03-27 | Disposition: A | Discharge status: Home / Self Care | Payer: Medicare Other | Source: Ambulatory Visit | Attending: Radiation Oncology | Admitting: Radiation Oncology

## 2020-03-27 DIAGNOSIS — D0511 Intraductal carcinoma in situ of right breast: Secondary | ICD-10-CM | POA: Diagnosis not present

## 2020-03-30 ENCOUNTER — Ambulatory Visit
Admission: RE | Admit: 2020-03-30 | Discharge: 2020-03-30 | Disposition: A | Discharge status: Home / Self Care | Payer: Medicare Other | Source: Ambulatory Visit | Attending: Radiation Oncology | Admitting: Radiation Oncology

## 2020-03-30 DIAGNOSIS — D0511 Intraductal carcinoma in situ of right breast: Secondary | ICD-10-CM | POA: Diagnosis not present

## 2020-03-31 ENCOUNTER — Ambulatory Visit
Admission: RE | Admit: 2020-03-31 | Discharge: 2020-03-31 | Disposition: A | Discharge status: Home / Self Care | Payer: Medicare Other | Source: Ambulatory Visit | Attending: Radiation Oncology | Admitting: Radiation Oncology

## 2020-03-31 DIAGNOSIS — D0511 Intraductal carcinoma in situ of right breast: Secondary | ICD-10-CM | POA: Diagnosis not present

## 2020-04-01 ENCOUNTER — Ambulatory Visit
Admission: RE | Admit: 2020-04-01 | Discharge: 2020-04-01 | Disposition: A | Discharge status: Home / Self Care | Payer: Medicare Other | Source: Ambulatory Visit | Attending: Radiation Oncology | Admitting: Radiation Oncology

## 2020-04-01 DIAGNOSIS — D0511 Intraductal carcinoma in situ of right breast: Secondary | ICD-10-CM | POA: Diagnosis not present

## 2020-04-02 ENCOUNTER — Ambulatory Visit
Admission: RE | Admit: 2020-04-02 | Discharge: 2020-04-02 | Disposition: A | Discharge status: Home / Self Care | Payer: Medicare Other | Source: Ambulatory Visit | Attending: Radiation Oncology | Admitting: Radiation Oncology

## 2020-04-02 DIAGNOSIS — D0511 Intraductal carcinoma in situ of right breast: Secondary | ICD-10-CM | POA: Diagnosis not present

## 2020-04-03 ENCOUNTER — Ambulatory Visit
Admission: RE | Admit: 2020-04-03 | Discharge: 2020-04-03 | Disposition: A | Discharge status: Home / Self Care | Payer: Medicare Other | Source: Ambulatory Visit | Attending: Radiation Oncology | Admitting: Radiation Oncology

## 2020-04-03 ENCOUNTER — Inpatient Hospital Stay: Payer: Medicare Other | Attending: Radiation Oncology

## 2020-04-03 ENCOUNTER — Other Ambulatory Visit: Payer: Self-pay

## 2020-04-03 DIAGNOSIS — D0511 Intraductal carcinoma in situ of right breast: Secondary | ICD-10-CM | POA: Diagnosis not present

## 2020-04-03 LAB — CBC
HCT: 41.3 % (ref 36.0–46.0)
Hemoglobin: 14 g/dL (ref 12.0–15.0)
MCH: 33.5 pg (ref 26.0–34.0)
MCHC: 33.9 g/dL (ref 30.0–36.0)
MCV: 98.8 fL (ref 80.0–100.0)
Platelets: 166 10*3/uL (ref 150–400)
RBC: 4.18 MIL/uL (ref 3.87–5.11)
RDW: 13.2 % (ref 11.5–15.5)
WBC: 4.5 10*3/uL (ref 4.0–10.5)
nRBC: 0 % (ref 0.0–0.2)

## 2020-04-06 ENCOUNTER — Ambulatory Visit
Admission: RE | Admit: 2020-04-06 | Discharge: 2020-04-06 | Disposition: A | Discharge status: Home / Self Care | Payer: Medicare Other | Source: Ambulatory Visit | Attending: Radiation Oncology | Admitting: Radiation Oncology

## 2020-04-06 DIAGNOSIS — D0511 Intraductal carcinoma in situ of right breast: Secondary | ICD-10-CM | POA: Diagnosis not present

## 2020-04-07 ENCOUNTER — Ambulatory Visit
Admission: RE | Admit: 2020-04-07 | Discharge: 2020-04-07 | Disposition: A | Discharge status: Home / Self Care | Payer: Medicare Other | Source: Ambulatory Visit | Attending: Radiation Oncology | Admitting: Radiation Oncology

## 2020-04-07 DIAGNOSIS — D0511 Intraductal carcinoma in situ of right breast: Secondary | ICD-10-CM | POA: Diagnosis not present

## 2020-04-08 ENCOUNTER — Ambulatory Visit
Admission: RE | Admit: 2020-04-08 | Discharge: 2020-04-08 | Disposition: A | Discharge status: Home / Self Care | Payer: Medicare Other | Source: Ambulatory Visit | Attending: Radiation Oncology | Admitting: Radiation Oncology

## 2020-04-08 DIAGNOSIS — D0511 Intraductal carcinoma in situ of right breast: Secondary | ICD-10-CM | POA: Diagnosis not present

## 2020-04-09 ENCOUNTER — Ambulatory Visit
Admission: RE | Admit: 2020-04-09 | Discharge: 2020-04-09 | Disposition: A | Discharge status: Home / Self Care | Payer: Medicare Other | Source: Ambulatory Visit | Attending: Radiation Oncology | Admitting: Radiation Oncology

## 2020-04-09 DIAGNOSIS — D0511 Intraductal carcinoma in situ of right breast: Secondary | ICD-10-CM | POA: Diagnosis not present

## 2020-04-10 ENCOUNTER — Ambulatory Visit
Admission: RE | Admit: 2020-04-10 | Discharge: 2020-04-10 | Disposition: A | Discharge status: Home / Self Care | Payer: Medicare Other | Source: Ambulatory Visit | Attending: Radiation Oncology | Admitting: Radiation Oncology

## 2020-04-10 DIAGNOSIS — D0511 Intraductal carcinoma in situ of right breast: Secondary | ICD-10-CM | POA: Diagnosis not present

## 2020-04-13 ENCOUNTER — Ambulatory Visit
Admission: RE | Admit: 2020-04-13 | Discharge: 2020-04-13 | Disposition: A | Discharge status: Home / Self Care | Payer: Medicare Other | Source: Ambulatory Visit | Attending: Radiation Oncology | Admitting: Radiation Oncology

## 2020-04-13 DIAGNOSIS — I10 Essential (primary) hypertension: Secondary | ICD-10-CM | POA: Insufficient documentation

## 2020-04-13 DIAGNOSIS — Z79899 Other long term (current) drug therapy: Secondary | ICD-10-CM | POA: Diagnosis not present

## 2020-04-13 DIAGNOSIS — M5136 Other intervertebral disc degeneration, lumbar region: Secondary | ICD-10-CM | POA: Insufficient documentation

## 2020-04-13 DIAGNOSIS — M129 Arthropathy, unspecified: Secondary | ICD-10-CM | POA: Diagnosis not present

## 2020-04-13 DIAGNOSIS — Z171 Estrogen receptor negative status [ER-]: Secondary | ICD-10-CM | POA: Diagnosis not present

## 2020-04-13 DIAGNOSIS — D0511 Intraductal carcinoma in situ of right breast: Secondary | ICD-10-CM | POA: Diagnosis not present

## 2020-04-13 DIAGNOSIS — F17210 Nicotine dependence, cigarettes, uncomplicated: Secondary | ICD-10-CM | POA: Diagnosis not present

## 2020-04-13 DIAGNOSIS — R011 Cardiac murmur, unspecified: Secondary | ICD-10-CM | POA: Insufficient documentation

## 2020-04-14 ENCOUNTER — Ambulatory Visit
Admission: RE | Admit: 2020-04-14 | Discharge: 2020-04-14 | Disposition: A | Discharge status: Home / Self Care | Payer: Medicare Other | Source: Ambulatory Visit | Attending: Radiation Oncology | Admitting: Radiation Oncology

## 2020-04-14 DIAGNOSIS — D0511 Intraductal carcinoma in situ of right breast: Secondary | ICD-10-CM | POA: Diagnosis not present

## 2020-04-15 ENCOUNTER — Ambulatory Visit
Admission: RE | Admit: 2020-04-15 | Discharge: 2020-04-15 | Disposition: A | Discharge status: Home / Self Care | Payer: Medicare Other | Source: Ambulatory Visit | Attending: Radiation Oncology | Admitting: Radiation Oncology

## 2020-04-15 DIAGNOSIS — D0511 Intraductal carcinoma in situ of right breast: Secondary | ICD-10-CM | POA: Diagnosis not present

## 2020-04-16 ENCOUNTER — Ambulatory Visit
Admission: RE | Admit: 2020-04-16 | Discharge: 2020-04-16 | Disposition: A | Discharge status: Home / Self Care | Payer: Medicare Other | Source: Ambulatory Visit | Attending: Radiation Oncology | Admitting: Radiation Oncology

## 2020-04-16 ENCOUNTER — Ambulatory Visit: Payer: Medicare Other

## 2020-04-16 DIAGNOSIS — D0511 Intraductal carcinoma in situ of right breast: Secondary | ICD-10-CM | POA: Diagnosis not present

## 2020-04-17 ENCOUNTER — Inpatient Hospital Stay: Payer: Medicare Other | Attending: Radiation Oncology

## 2020-04-17 ENCOUNTER — Other Ambulatory Visit: Payer: Self-pay

## 2020-04-17 ENCOUNTER — Ambulatory Visit: Payer: Medicare Other

## 2020-04-17 ENCOUNTER — Ambulatory Visit
Admission: RE | Admit: 2020-04-17 | Discharge: 2020-04-17 | Disposition: A | Discharge status: Home / Self Care | Payer: Medicare Other | Source: Ambulatory Visit | Attending: Radiation Oncology | Admitting: Radiation Oncology

## 2020-04-17 DIAGNOSIS — D0511 Intraductal carcinoma in situ of right breast: Secondary | ICD-10-CM | POA: Insufficient documentation

## 2020-04-17 LAB — CBC
HCT: 40.5 % (ref 36.0–46.0)
Hemoglobin: 13.7 g/dL (ref 12.0–15.0)
MCH: 33.6 pg (ref 26.0–34.0)
MCHC: 33.8 g/dL (ref 30.0–36.0)
MCV: 99.3 fL (ref 80.0–100.0)
Platelets: 150 10*3/uL (ref 150–400)
RBC: 4.08 MIL/uL (ref 3.87–5.11)
RDW: 13.2 % (ref 11.5–15.5)
WBC: 4.1 10*3/uL (ref 4.0–10.5)
nRBC: 0 % (ref 0.0–0.2)

## 2020-04-20 ENCOUNTER — Ambulatory Visit
Admission: RE | Admit: 2020-04-20 | Discharge: 2020-04-20 | Disposition: A | Discharge status: Home / Self Care | Payer: Medicare Other | Source: Ambulatory Visit | Attending: Radiation Oncology | Admitting: Radiation Oncology

## 2020-04-20 DIAGNOSIS — D0511 Intraductal carcinoma in situ of right breast: Secondary | ICD-10-CM | POA: Diagnosis not present

## 2020-04-21 ENCOUNTER — Ambulatory Visit
Admission: RE | Admit: 2020-04-21 | Discharge: 2020-04-21 | Disposition: A | Discharge status: Home / Self Care | Payer: Medicare Other | Source: Ambulatory Visit | Attending: Radiation Oncology | Admitting: Radiation Oncology

## 2020-04-21 ENCOUNTER — Ambulatory Visit: Payer: Medicare Other

## 2020-04-21 ENCOUNTER — Other Ambulatory Visit: Payer: Self-pay | Admitting: *Deleted

## 2020-04-21 DIAGNOSIS — D0511 Intraductal carcinoma in situ of right breast: Secondary | ICD-10-CM | POA: Diagnosis not present

## 2020-04-21 MED ORDER — SILVER SULFADIAZINE 1 % EX CREA: 1.0000 "application " | TOPICAL_CREAM | Freq: Two times a day (BID) | CUTANEOUS | 2 refills | Status: AC

## 2020-04-22 ENCOUNTER — Ambulatory Visit: Payer: Medicare Other

## 2020-04-23 ENCOUNTER — Ambulatory Visit: Admission: RE | Admit: 2020-04-23 | Payer: Medicare Other | Source: Ambulatory Visit

## 2020-04-23 ENCOUNTER — Ambulatory Visit: Payer: Medicare Other

## 2020-04-23 DIAGNOSIS — D0511 Intraductal carcinoma in situ of right breast: Secondary | ICD-10-CM | POA: Diagnosis not present

## 2020-04-24 ENCOUNTER — Ambulatory Visit: Payer: Medicare Other

## 2020-04-27 ENCOUNTER — Ambulatory Visit: Payer: Medicare Other

## 2020-04-27 ENCOUNTER — Ambulatory Visit
Admission: RE | Admit: 2020-04-27 | Discharge: 2020-04-27 | Disposition: A | Discharge status: Home / Self Care | Payer: Medicare Other | Source: Ambulatory Visit | Attending: Radiation Oncology | Admitting: Radiation Oncology

## 2020-04-27 DIAGNOSIS — D0511 Intraductal carcinoma in situ of right breast: Secondary | ICD-10-CM | POA: Diagnosis not present

## 2020-04-28 ENCOUNTER — Ambulatory Visit
Admission: RE | Admit: 2020-04-28 | Discharge: 2020-04-28 | Disposition: A | Discharge status: Home / Self Care | Payer: Medicare Other | Source: Ambulatory Visit | Attending: Radiation Oncology | Admitting: Radiation Oncology

## 2020-04-28 DIAGNOSIS — D0511 Intraductal carcinoma in situ of right breast: Secondary | ICD-10-CM | POA: Diagnosis not present

## 2020-04-29 ENCOUNTER — Ambulatory Visit
Admission: RE | Admit: 2020-04-29 | Discharge: 2020-04-29 | Disposition: A | Discharge status: Home / Self Care | Payer: Medicare Other | Source: Ambulatory Visit | Attending: Radiation Oncology | Admitting: Radiation Oncology

## 2020-04-29 DIAGNOSIS — D0511 Intraductal carcinoma in situ of right breast: Secondary | ICD-10-CM | POA: Diagnosis not present

## 2020-04-30 ENCOUNTER — Ambulatory Visit
Admission: RE | Admit: 2020-04-30 | Discharge: 2020-04-30 | Disposition: A | Discharge status: Home / Self Care | Payer: Medicare Other | Source: Ambulatory Visit | Attending: Radiation Oncology | Admitting: Radiation Oncology

## 2020-04-30 DIAGNOSIS — D0511 Intraductal carcinoma in situ of right breast: Secondary | ICD-10-CM | POA: Diagnosis not present

## 2020-05-01 ENCOUNTER — Other Ambulatory Visit: Payer: Self-pay

## 2020-05-01 ENCOUNTER — Ambulatory Visit
Admission: RE | Admit: 2020-05-01 | Discharge: 2020-05-01 | Disposition: A | Discharge status: Home / Self Care | Payer: Medicare Other | Source: Ambulatory Visit | Attending: Radiation Oncology | Admitting: Radiation Oncology

## 2020-05-01 ENCOUNTER — Inpatient Hospital Stay: Payer: Medicare Other

## 2020-05-01 DIAGNOSIS — D0511 Intraductal carcinoma in situ of right breast: Secondary | ICD-10-CM | POA: Diagnosis not present

## 2020-05-01 LAB — CBC
HCT: 41.5 % (ref 36.0–46.0)
Hemoglobin: 14.2 g/dL (ref 12.0–15.0)
MCH: 34.1 pg — ABNORMAL HIGH (ref 26.0–34.0)
MCHC: 34.2 g/dL (ref 30.0–36.0)
MCV: 99.5 fL (ref 80.0–100.0)
Platelets: 137 10*3/uL — ABNORMAL LOW (ref 150–400)
RBC: 4.17 MIL/uL (ref 3.87–5.11)
RDW: 13.2 % (ref 11.5–15.5)
WBC: 3.6 10*3/uL — ABNORMAL LOW (ref 4.0–10.5)
nRBC: 0 % (ref 0.0–0.2)

## 2020-05-04 ENCOUNTER — Ambulatory Visit
Admission: RE | Admit: 2020-05-04 | Discharge: 2020-05-04 | Disposition: A | Discharge status: Home / Self Care | Payer: Medicare Other | Source: Ambulatory Visit | Attending: Radiation Oncology | Admitting: Radiation Oncology

## 2020-05-04 DIAGNOSIS — D0511 Intraductal carcinoma in situ of right breast: Secondary | ICD-10-CM | POA: Diagnosis not present

## 2020-05-05 ENCOUNTER — Ambulatory Visit
Admission: RE | Admit: 2020-05-05 | Discharge: 2020-05-05 | Disposition: A | Discharge status: Home / Self Care | Payer: Medicare Other | Source: Ambulatory Visit | Attending: Radiation Oncology | Admitting: Radiation Oncology

## 2020-05-05 DIAGNOSIS — D0511 Intraductal carcinoma in situ of right breast: Secondary | ICD-10-CM | POA: Diagnosis not present

## 2020-05-06 ENCOUNTER — Ambulatory Visit
Admission: RE | Admit: 2020-05-06 | Discharge: 2020-05-06 | Disposition: A | Discharge status: Home / Self Care | Payer: Medicare Other | Source: Ambulatory Visit | Attending: Radiation Oncology | Admitting: Radiation Oncology

## 2020-05-06 DIAGNOSIS — D0511 Intraductal carcinoma in situ of right breast: Secondary | ICD-10-CM | POA: Diagnosis not present

## 2020-05-07 ENCOUNTER — Ambulatory Visit
Admission: RE | Admit: 2020-05-07 | Discharge: 2020-05-07 | Disposition: A | Discharge status: Home / Self Care | Payer: Medicare Other | Source: Ambulatory Visit | Attending: Radiation Oncology | Admitting: Radiation Oncology

## 2020-05-07 ENCOUNTER — Ambulatory Visit: Payer: Medicare Other

## 2020-05-07 DIAGNOSIS — D0511 Intraductal carcinoma in situ of right breast: Secondary | ICD-10-CM | POA: Diagnosis not present

## 2020-05-08 ENCOUNTER — Ambulatory Visit
Admission: RE | Admit: 2020-05-08 | Discharge: 2020-05-08 | Disposition: A | Discharge status: Home / Self Care | Payer: Medicare Other | Source: Ambulatory Visit | Attending: Radiation Oncology | Admitting: Radiation Oncology

## 2020-05-08 DIAGNOSIS — D0511 Intraductal carcinoma in situ of right breast: Secondary | ICD-10-CM | POA: Diagnosis not present

## 2020-05-12 ENCOUNTER — Ambulatory Visit
Admission: RE | Admit: 2020-05-12 | Discharge: 2020-05-12 | Disposition: A | Discharge status: Home / Self Care | Payer: Medicare Other | Source: Ambulatory Visit | Attending: Radiation Oncology | Admitting: Radiation Oncology

## 2020-05-12 DIAGNOSIS — R011 Cardiac murmur, unspecified: Secondary | ICD-10-CM | POA: Diagnosis not present

## 2020-05-12 DIAGNOSIS — I10 Essential (primary) hypertension: Secondary | ICD-10-CM | POA: Insufficient documentation

## 2020-05-12 DIAGNOSIS — M5136 Other intervertebral disc degeneration, lumbar region: Secondary | ICD-10-CM | POA: Insufficient documentation

## 2020-05-12 DIAGNOSIS — Z79899 Other long term (current) drug therapy: Secondary | ICD-10-CM | POA: Insufficient documentation

## 2020-05-12 DIAGNOSIS — Z171 Estrogen receptor negative status [ER-]: Secondary | ICD-10-CM | POA: Diagnosis not present

## 2020-05-12 DIAGNOSIS — F17210 Nicotine dependence, cigarettes, uncomplicated: Secondary | ICD-10-CM | POA: Diagnosis not present

## 2020-05-12 DIAGNOSIS — M129 Arthropathy, unspecified: Secondary | ICD-10-CM | POA: Insufficient documentation

## 2020-05-12 DIAGNOSIS — D0511 Intraductal carcinoma in situ of right breast: Secondary | ICD-10-CM | POA: Diagnosis present

## 2020-05-13 ENCOUNTER — Ambulatory Visit
Admission: RE | Admit: 2020-05-13 | Discharge: 2020-05-13 | Disposition: A | Discharge status: Home / Self Care | Payer: Medicare Other | Source: Ambulatory Visit | Attending: Radiation Oncology | Admitting: Radiation Oncology

## 2020-05-13 DIAGNOSIS — D0511 Intraductal carcinoma in situ of right breast: Secondary | ICD-10-CM | POA: Diagnosis not present

## 2020-06-25 ENCOUNTER — Ambulatory Visit
Admission: RE | Admit: 2020-06-25 | Discharge: 2020-06-25 | Disposition: A | Discharge status: Home / Self Care | Payer: Medicare Other | Source: Ambulatory Visit | Attending: Radiation Oncology | Admitting: Radiation Oncology

## 2020-06-25 ENCOUNTER — Other Ambulatory Visit: Payer: Self-pay

## 2020-06-25 ENCOUNTER — Encounter: Payer: Self-pay | Admitting: Radiation Oncology

## 2020-06-25 DIAGNOSIS — D0511 Intraductal carcinoma in situ of right breast: Secondary | ICD-10-CM | POA: Insufficient documentation

## 2020-06-25 DIAGNOSIS — Z171 Estrogen receptor negative status [ER-]: Secondary | ICD-10-CM | POA: Diagnosis not present

## 2020-06-25 DIAGNOSIS — Z923 Personal history of irradiation: Secondary | ICD-10-CM | POA: Diagnosis not present

## 2020-06-25 NOTE — Progress Notes (Signed)
Radiation Oncology Follow up Note  Name: Deanna Grant   Date:   06/25/2020 MRN:  [id number] DOB: 1946-02-11    This 74 y.o. 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  REFERRING PROVIDER: Tracie Harrier, MD  HPI: Patient is a 74 year old female now at 1 month having completed whole breast radiation to her right breast for ER negative stage 0 ductal carcinoma in situ.  She is seen today in routine follow-up is doing well she had trouble during treatments with skin reaction mostly in the medial portion of of her right breast which is cleared at this point.  She specifically denies breast tenderness cough or bone pain.  She is not been started on antiestrogen therapy based on the negative ER nature of her disease..  COMPLICATIONS OF TREATMENT: none  FOLLOW UP COMPLIANCE: keeps appointments   PHYSICAL EXAM:  BP (!) (P) 161/85 (BP Location: Left Arm, Patient Position: Sitting)   Pulse (P) 73   Temp (!) (P) 96.7 F (35.9 C)   Resp (P) 16   Wt (P) 158 lb 9.6 oz (71.9 kg)   BMI (P) 30.97 kg/m  Lungs are clear to A&P cardiac examination essentially unremarkable with regular rate and rhythm. No dominant mass or nodularity is noted in either breast in 2 positions examined. Incision is well-healed. No axillary or supraclavicular adenopathy is appreciated. Cosmetic result is excellent.  Well-developed well-nourished patient in NAD. HEENT reveals PERLA, EOMI, discs not visualized.  Oral cavity is clear. No oral mucosal lesions are identified. Neck is clear without evidence of cervical or supraclavicular adenopathy. Lungs are clear to A&P. Cardiac examination is essentially unremarkable with regular rate and rhythm without murmur rub or thrill. Abdomen is benign with no organomegaly or masses noted. Motor sensory and DTR levels are equal and  symmetric in the upper and lower extremities. Cranial nerves II through XII are grossly intact. Proprioception is intact. No peripheral adenopathy or edema is identified. No motor or sensory levels are noted. Crude visual fields are within normal range.  RADIOLOGY RESULTS: No current films to review  PLAN: Present time patient is recovered nicely from her whole breast radiation and pleased with her overall progress she continues close follow-up care with Dr. Tollie Grant.  She is not on antiestrogen therapy based on the negative ER status of her tumor.  I have asked to see her back in 4 to 5 months for follow-up.  Patient knows to call with any concerns.  I would like to take this opportunity to thank you for allowing me to participate in the care of your patient.Noreene Filbert, MD

## 2020-07-02 NOTE — Progress Notes (Signed)
Phoned patient to determine if she wanted Medical Oncology follow-up post lumpectomy and radiation for DCIS.  Patient is ER negative.  Patient states she does not wish to schedule a Medical Oncology consult.

## 2020-07-16 ENCOUNTER — Other Ambulatory Visit: Payer: Self-pay | Admitting: General Surgery

## 2020-07-17 LAB — SURGICAL PATHOLOGY

## 2020-09-01 ENCOUNTER — Encounter: Payer: Self-pay | Admitting: *Deleted

## 2020-11-30 ENCOUNTER — Ambulatory Visit: Payer: Medicare Other | Admitting: Radiation Oncology

## 2020-12-16 ENCOUNTER — Other Ambulatory Visit: Payer: Self-pay

## 2020-12-16 ENCOUNTER — Ambulatory Visit
Admission: RE | Admit: 2020-12-16 | Discharge: 2020-12-16 | Disposition: A | Discharge status: Home / Self Care | Payer: Medicare Other | Source: Ambulatory Visit | Attending: Radiation Oncology | Admitting: Radiation Oncology

## 2020-12-16 VITALS — BP 152/83 | HR 67 | Temp 97.5°F | Resp 12 | Wt 151.0 lb

## 2020-12-16 DIAGNOSIS — Z86000 Personal history of in-situ neoplasm of breast: Secondary | ICD-10-CM | POA: Diagnosis not present

## 2020-12-16 DIAGNOSIS — D0511 Intraductal carcinoma in situ of right breast: Secondary | ICD-10-CM

## 2020-12-16 DIAGNOSIS — Z923 Personal history of irradiation: Secondary | ICD-10-CM | POA: Diagnosis not present

## 2020-12-16 NOTE — Progress Notes (Signed)
Radiation Oncology Follow up Note  Name: Deanna Grant   Date:   12/16/2020 MRN:  [id number] DOB: 04-10-1946    This 75 y.o. 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  Seen today in routine follow-up she is doing well.  She still has some occasional tenderness in the right breast.  Otherwise is without complaint.  She is not on antiestrogen therapy based on the ER negative nature of her disease.  She has not had mammograms performed yet..  COMPLICATIONS OF TREATMENT: none  FOLLOW UP COMPLIANCE: keeps appointments   PHYSICAL EXAM:  BP (!) 152/83 (BP Location: Left Arm, Patient Position: Sitting, Cuff Size: Normal)   Pulse 67   Temp (!) 97.5 F (36.4 C) (Tympanic)   Resp 12   Wt 151 lb (68.5 kg)   BMI 29.49 kg/m  Lungs are clear to A&P cardiac examination essentially unremarkable with regular rate and rhythm. No dominant mass or nodularity is noted in either breast in 2 positions examined. Incision is well-healed. No axillary or supraclavicular adenopathy is appreciated. Cosmetic result is excellent.  Still some slight hyperpigmentation of the skin is noted in the right breast.  Well-developed well-nourished patient in NAD. HEENT reveals PERLA, EOMI, discs not visualized.  Oral cavity is clear. No oral mucosal lesions are identified. Neck is clear without evidence of cervical or supraclavicular adenopathy. Lungs are clear to A&P. Cardiac examination is essentially unremarkable with regular rate and rhythm without murmur rub or thrill. Abdomen is benign with no organomegaly or masses noted. Motor sensory and DTR levels are equal and symmetric in the upper and lower extremities.  Cranial nerves II through XII are grossly intact. Proprioception is intact. No peripheral adenopathy or edema is identified. No motor or sensory levels are noted. Crude visual fields are within normal range.  RADIOLOGY RESULTS: No current films to review  PLAN: Present time she is doing well 6 months out with no evidence of disease.  I am pleased with her overall progress.  Of asked to see her back in 6 months for follow-up.  I have asked her to speak to her oncologist or surgeon about ordering mammograms if not we will order mammograms at her next visit.  Patient knows to call with any concerns.  I would like to take this opportunity to thank you for allowing me to participate in the care of your patient.Carmina Miller, MD

## 2021-01-19 ENCOUNTER — Other Ambulatory Visit: Payer: Self-pay | Admitting: General Surgery

## 2021-01-19 DIAGNOSIS — Z853 Personal history of malignant neoplasm of breast: Secondary | ICD-10-CM

## 2021-02-11 ENCOUNTER — Ambulatory Visit
Admission: RE | Admit: 2021-02-11 | Discharge: 2021-02-11 | Disposition: A | Discharge status: Home / Self Care | Payer: Medicare Other | Source: Ambulatory Visit | Attending: General Surgery | Admitting: General Surgery

## 2021-02-11 ENCOUNTER — Other Ambulatory Visit: Payer: Self-pay

## 2021-02-11 DIAGNOSIS — Z853 Personal history of malignant neoplasm of breast: Secondary | ICD-10-CM | POA: Diagnosis present

## 2021-02-11 HISTORY — DX: Personal history of irradiation: Z92.3

## 2021-04-19 ENCOUNTER — Other Ambulatory Visit: Payer: Self-pay | Admitting: General Surgery

## 2021-04-19 DIAGNOSIS — D0511 Intraductal carcinoma in situ of right breast: Secondary | ICD-10-CM

## 2021-06-01 ENCOUNTER — Ambulatory Visit
Admission: RE | Admit: 2021-06-01 | Discharge: 2021-06-01 | Disposition: A | Discharge status: Home / Self Care | Payer: Medicare Other | Source: Ambulatory Visit | Attending: General Surgery | Admitting: General Surgery

## 2021-06-01 ENCOUNTER — Other Ambulatory Visit: Payer: Self-pay

## 2021-06-01 DIAGNOSIS — D0511 Intraductal carcinoma in situ of right breast: Secondary | ICD-10-CM | POA: Diagnosis present

## 2021-06-16 ENCOUNTER — Ambulatory Visit: Payer: Medicare Other | Admitting: Radiation Oncology

## 2021-12-08 IMAGING — MG MM DIGITAL DIAGNOSTIC UNILAT*R* W/ TOMO W/ CAD
4 series · 4 of 8 positions shown · non-contrast
Comparison: Previous exam(s).

CLINICAL DATA: Screening recall for a right breast asymmetry with
calcifications.

EXAM:
DIGITAL DIAGNOSTIC RIGHT MAMMOGRAM WITH CAD AND TOMO
ULTRASOUND RIGHT BREAST

[R CC]
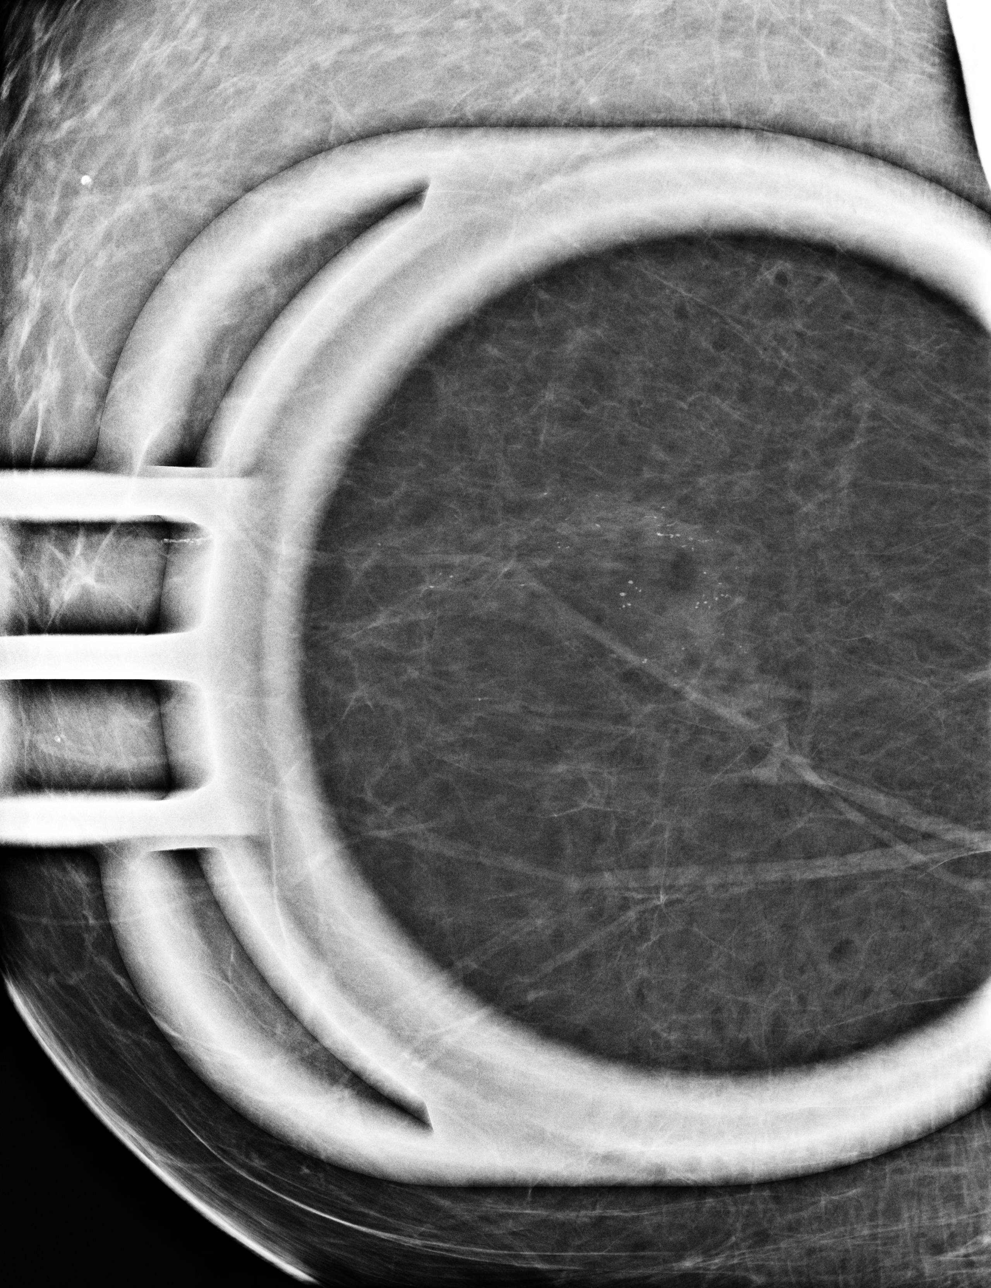

[R ML]
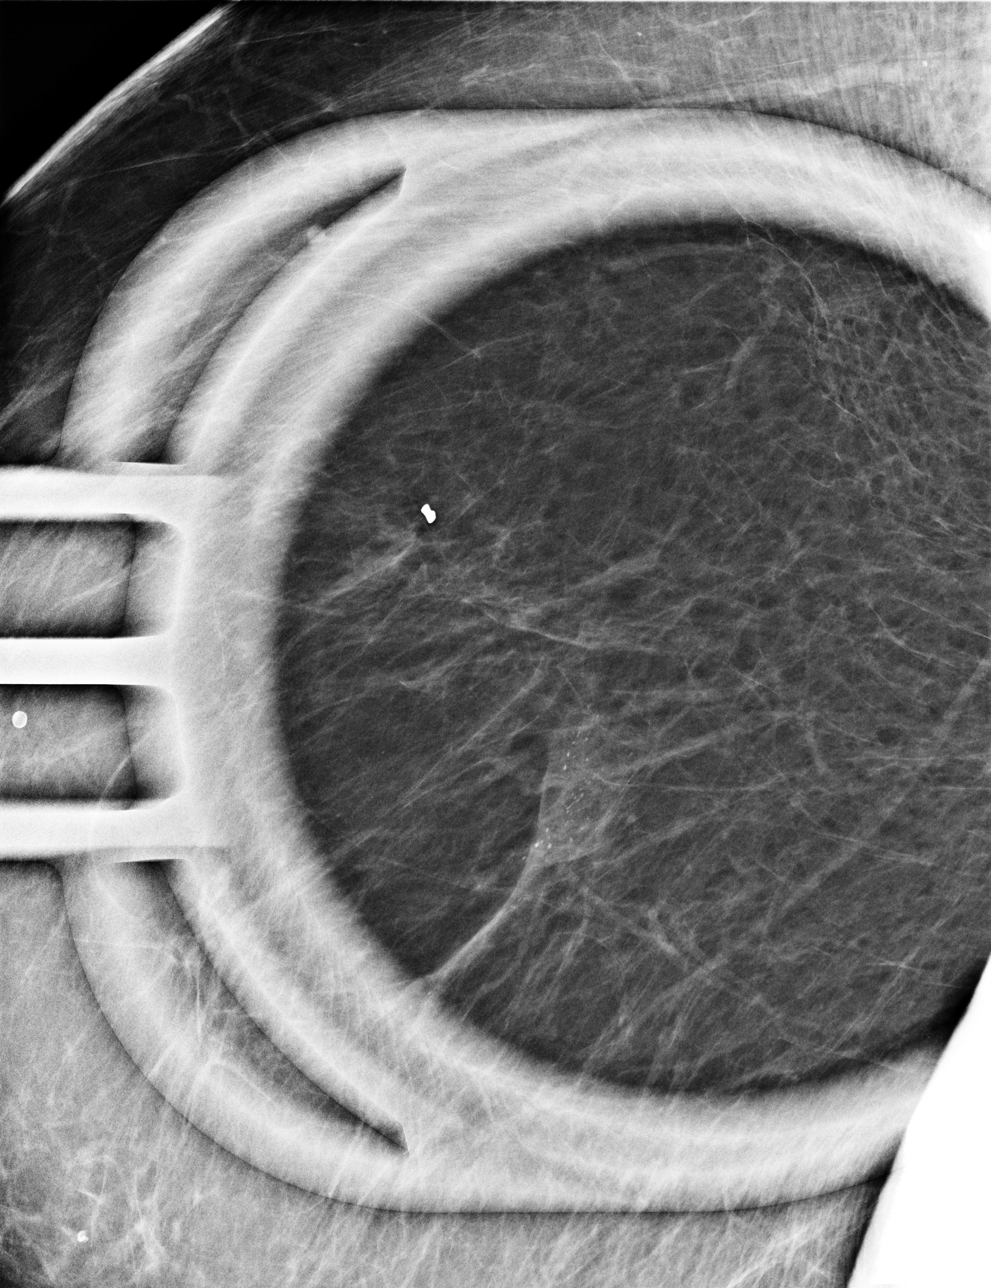

[R ML synth-2D]
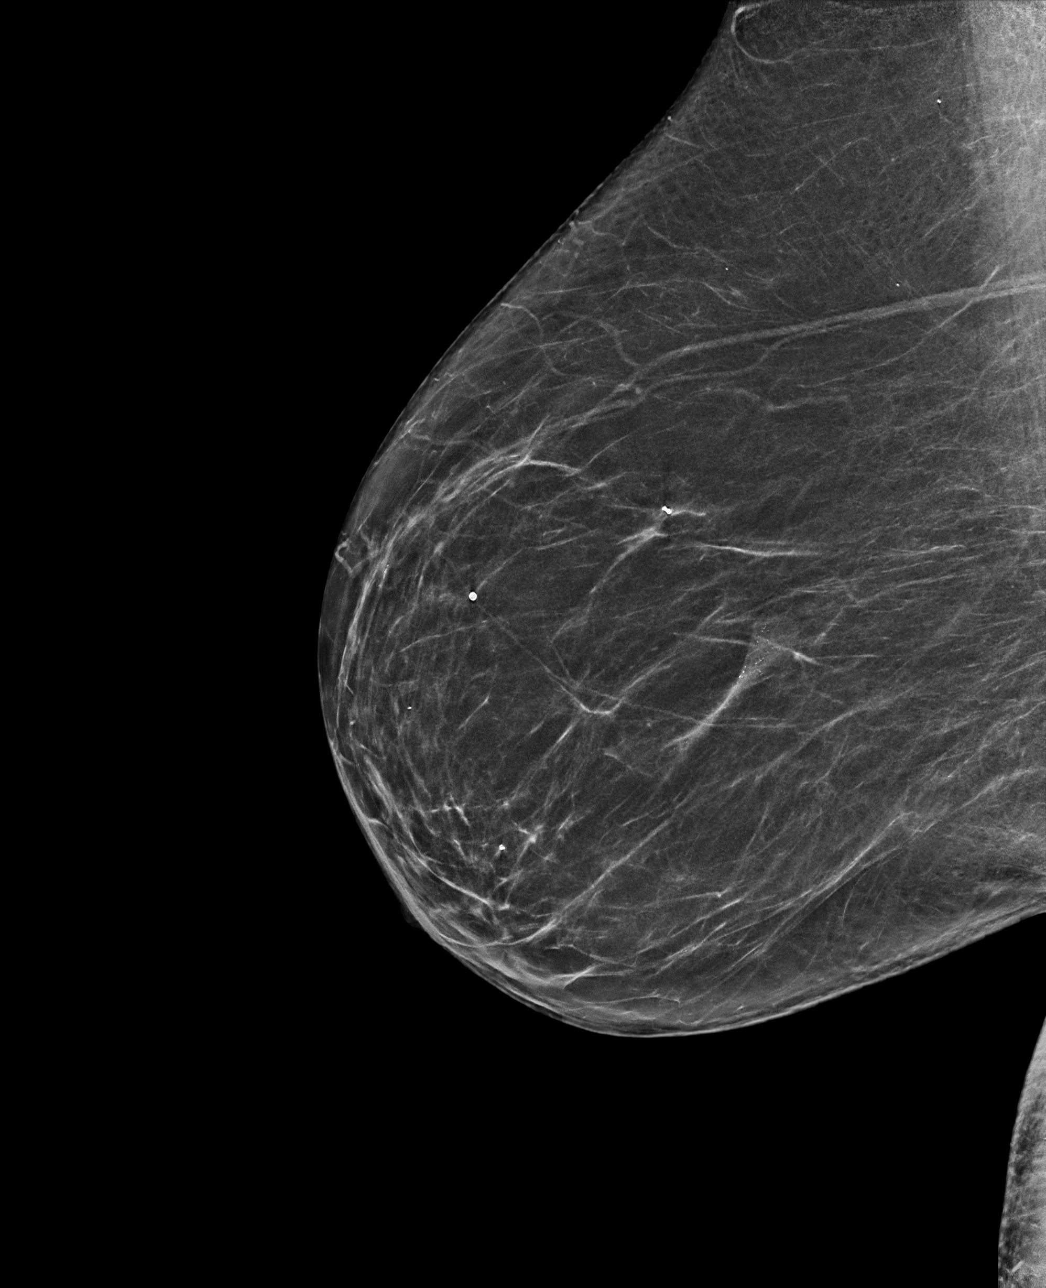

[R ML tomo · tomo slice 31/62.0]
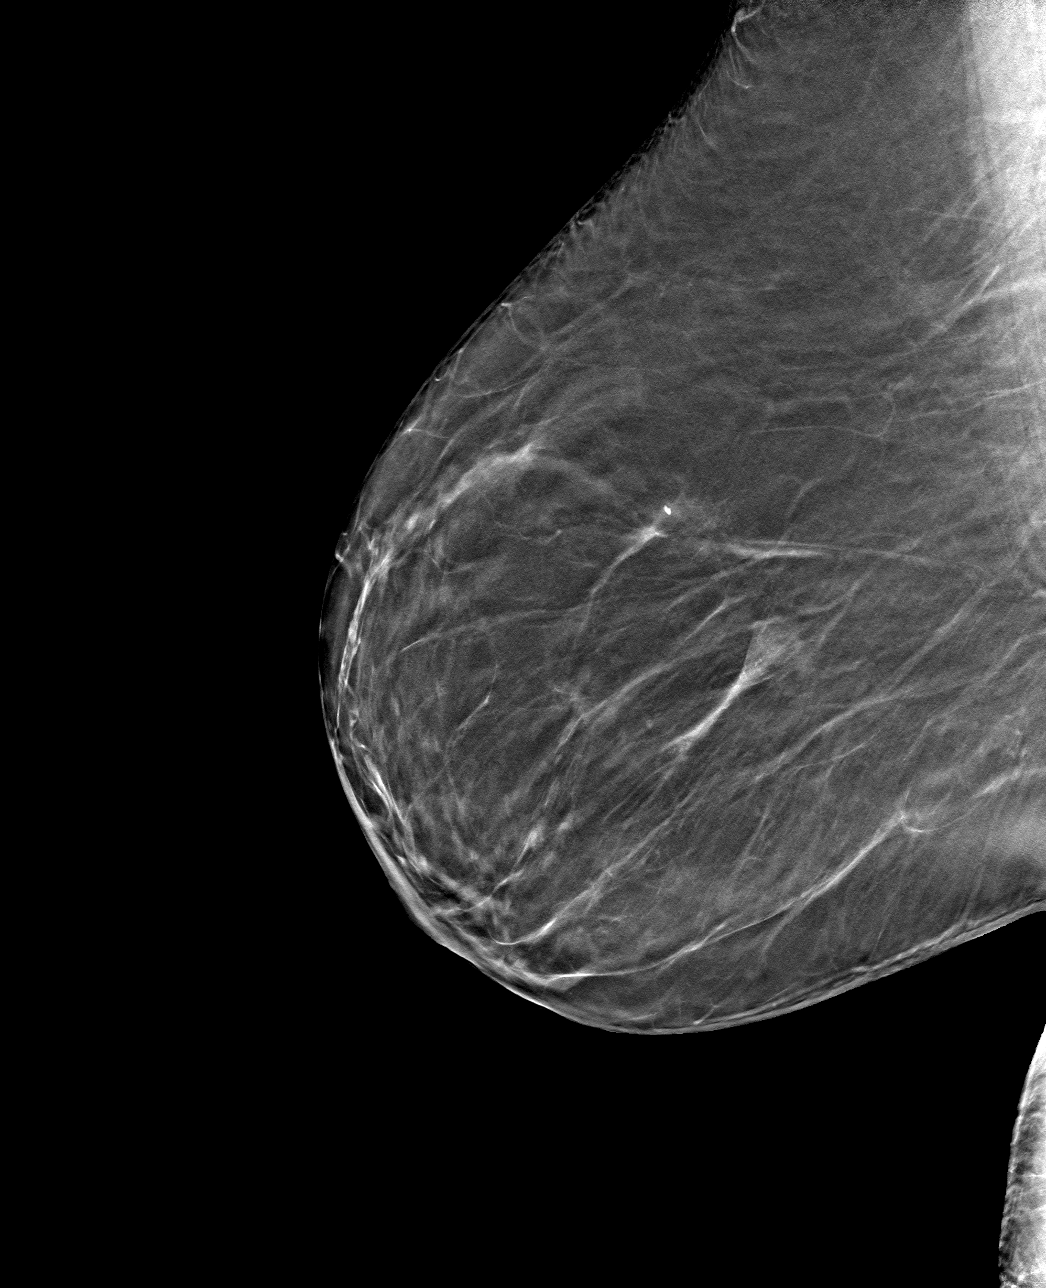

[4 of 8 positions shown; findings below may reference images not displayed]

ACR Breast Density Category b: There are scattered areas of
fibroglandular density.
FINDINGS: Spot compression magnification images demonstrates a 4.0 cm area of
pleomorphic calcifications in the central right breast, some of
which are fine and linear with an associated asymmetry. These are in
a segmental distribution.

Mammographic images were processed with CAD.

Ultrasound of the central to medial right breast demonstrates no
suspicious masses or areas of shadowing. Ultrasound of the right
axilla demonstrates multiple normal-appearing lymph nodes.
IMPRESSION: There is a 4.0 cm region of suspicious calcifications with
associated asymmetry.

RECOMMENDATION:
Stereotactic biopsy is recommended for the anterior and posterior
margin of the asymmetry with calcifications in the right breast.

I have discussed the findings and recommendations with the patient.
If applicable, a reminder letter will be sent to the patient
regarding the next appointment.

BI-RADS CATEGORY  4: Suspicious.

## 2022-01-12 IMAGING — MG MM BREAST SURGICAL SPECIMEN
2 series · 2 of 2 positions shown · non-contrast
Comparison: Previous exam(s).

CLINICAL DATA: Status post RF tagged and wire bracket localized
right breast lumpectomy.

EXAM:
SPECIMEN RADIOGRAPH OF THE RIGHT BREAST

[R (1 of 2)]
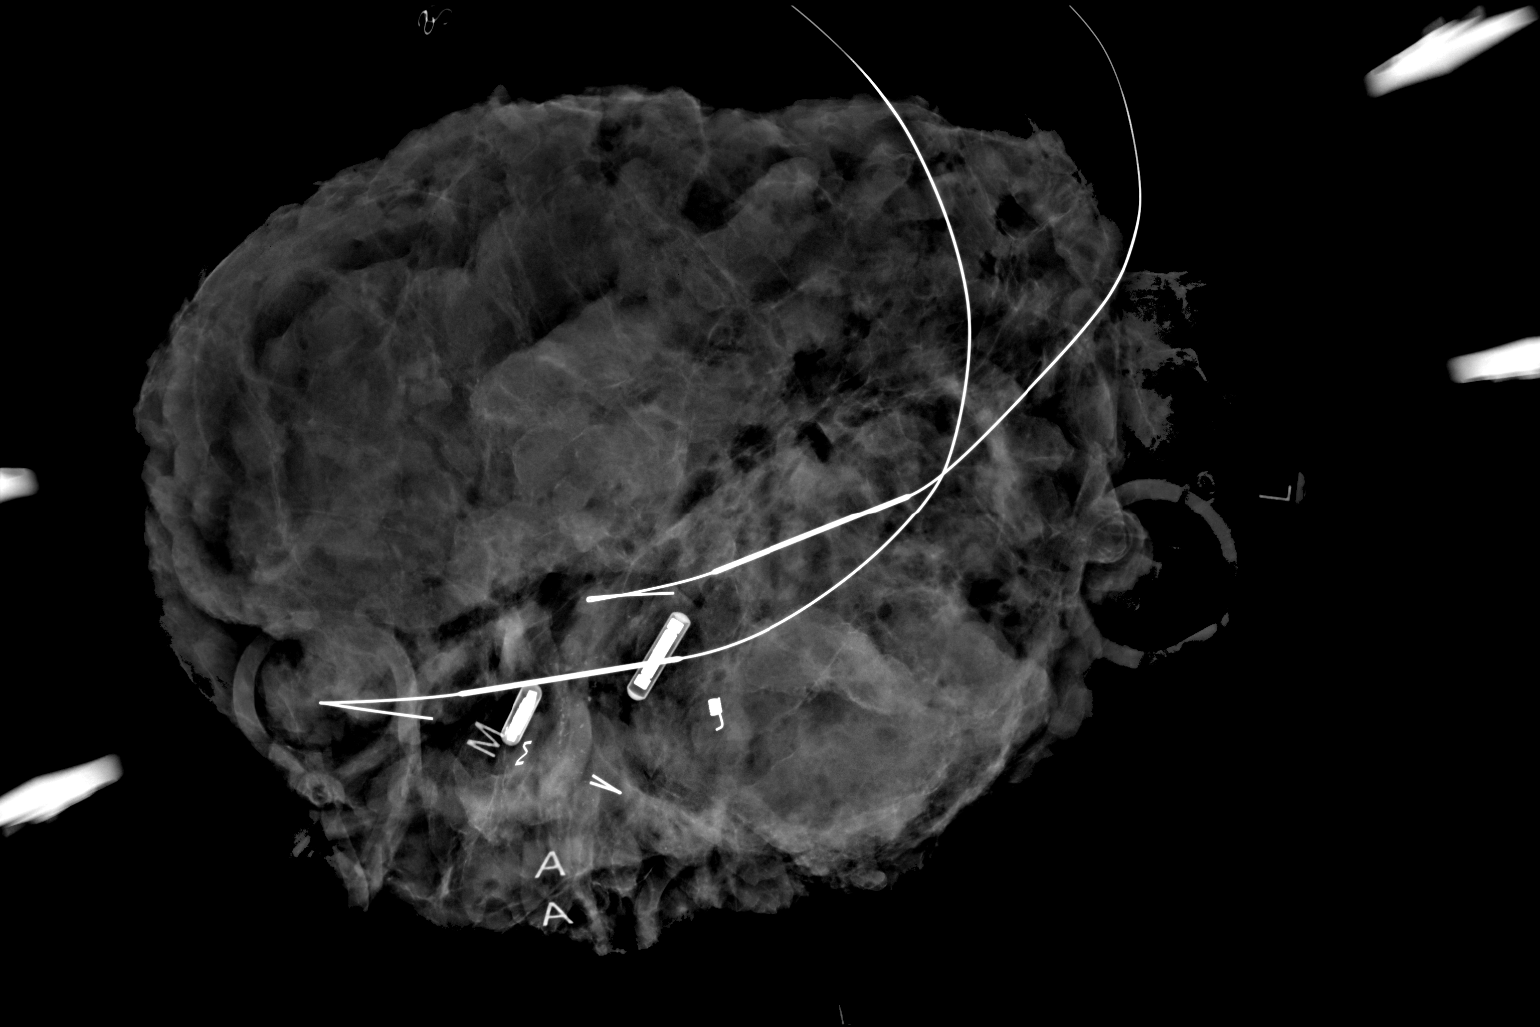

[R (2 of 2)]
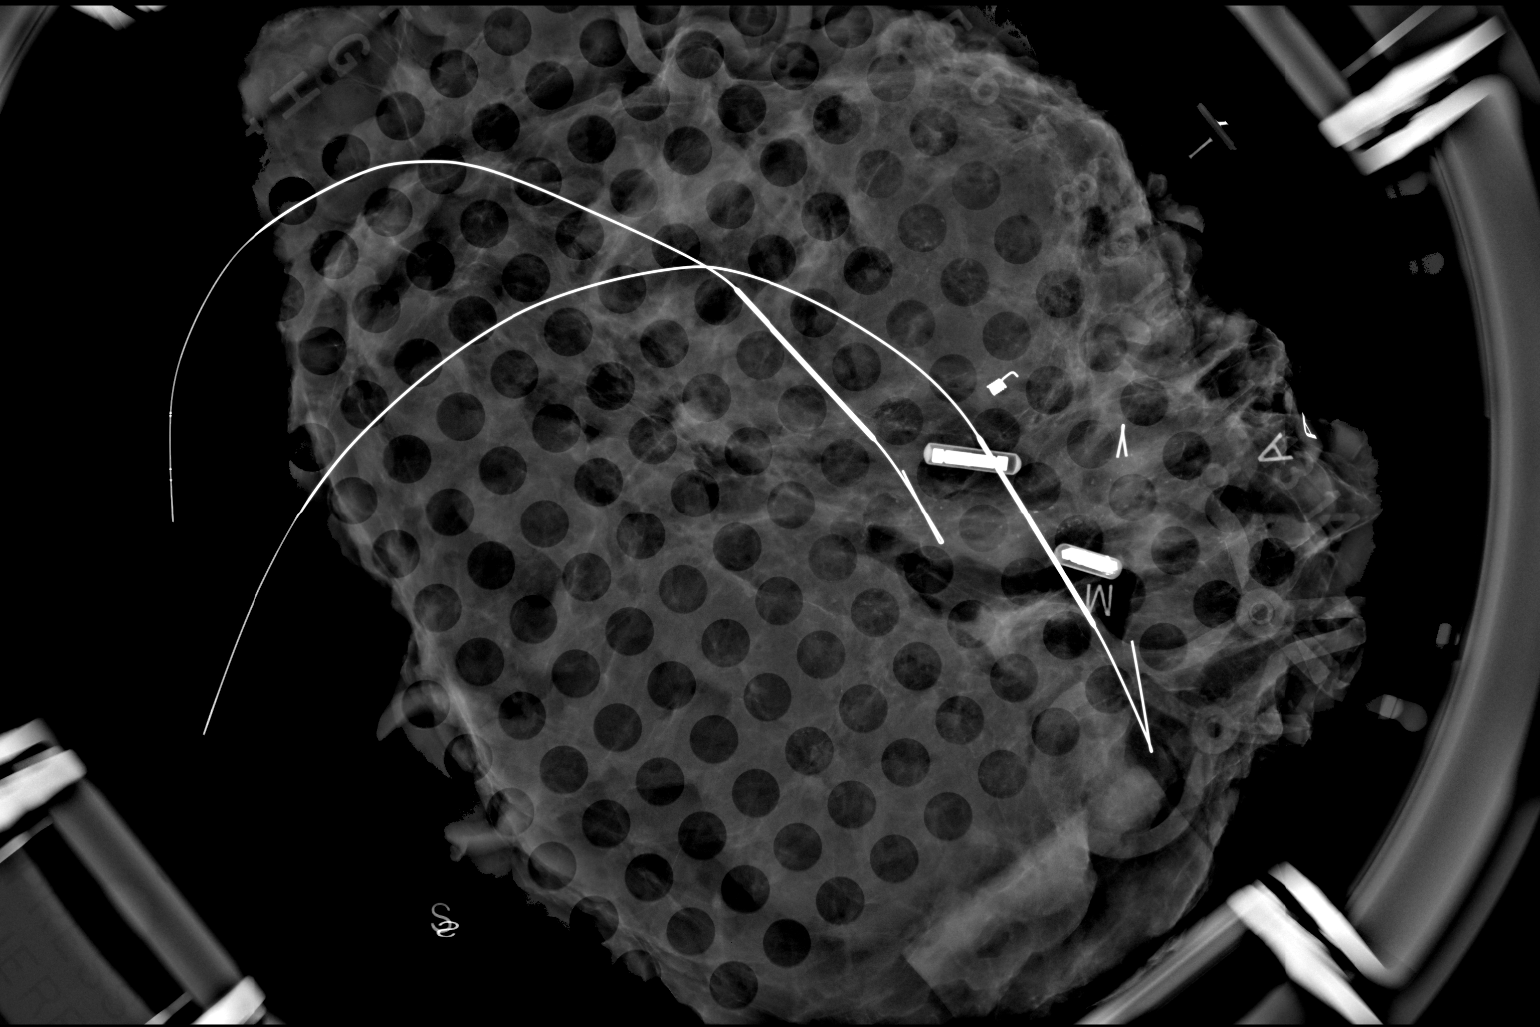

[2 of 2 positions shown; findings below may reference images not displayed]

FINDINGS: Status post excision of the right breast. The 2 targeted biopsy
clips including the anterior ribbon clip and posterior coil clip are
within the specimen. The 2 RF tags and both wires are also present
within the specimen.
IMPRESSION: Specimen radiograph of the right breast.

## 2022-02-21 ENCOUNTER — Other Ambulatory Visit: Payer: Self-pay | Admitting: Internal Medicine

## 2022-02-21 DIAGNOSIS — D0510 Intraductal carcinoma in situ of unspecified breast: Secondary | ICD-10-CM

## 2022-02-21 DIAGNOSIS — Z1231 Encounter for screening mammogram for malignant neoplasm of breast: Secondary | ICD-10-CM

## 2022-03-17 ENCOUNTER — Ambulatory Visit
Admission: RE | Admit: 2022-03-17 | Discharge: 2022-03-17 | Disposition: A | Discharge status: Home / Self Care | Payer: Medicare Other | Source: Ambulatory Visit | Attending: Internal Medicine | Admitting: Internal Medicine

## 2022-03-17 DIAGNOSIS — Z853 Personal history of malignant neoplasm of breast: Secondary | ICD-10-CM | POA: Insufficient documentation

## 2022-03-17 DIAGNOSIS — Z1231 Encounter for screening mammogram for malignant neoplasm of breast: Secondary | ICD-10-CM | POA: Insufficient documentation

## 2022-03-17 DIAGNOSIS — D0510 Intraductal carcinoma in situ of unspecified breast: Secondary | ICD-10-CM | POA: Insufficient documentation

## 2023-03-24 LAB — COLOGUARD: COLOGUARD: POSITIVE — AB

## 2023-05-31 ENCOUNTER — Other Ambulatory Visit: Payer: Self-pay | Admitting: Internal Medicine

## 2023-05-31 DIAGNOSIS — Z1231 Encounter for screening mammogram for malignant neoplasm of breast: Secondary | ICD-10-CM

## 2023-06-09 ENCOUNTER — Ambulatory Visit
Admission: RE | Admit: 2023-06-09 | Discharge: 2023-06-09 | Disposition: A | Discharge status: Home / Self Care | Payer: Medicare Other | Source: Ambulatory Visit | Attending: Internal Medicine | Admitting: Internal Medicine

## 2023-06-09 DIAGNOSIS — Z1231 Encounter for screening mammogram for malignant neoplasm of breast: Secondary | ICD-10-CM | POA: Diagnosis present

## 2023-10-12 ENCOUNTER — Encounter: Payer: Self-pay | Admitting: Internal Medicine

## 2023-10-25 ENCOUNTER — Ambulatory Visit: Admission: RE | Admit: 2023-10-25 | Payer: Medicare Other | Source: Home / Self Care | Admitting: Internal Medicine

## 2023-10-25 HISTORY — DX: Personal history of other diseases of the musculoskeletal system and connective tissue: Z87.39

## 2023-10-25 HISTORY — DX: Other amnesia: R41.3

## 2023-10-25 HISTORY — DX: Asymptomatic varicose veins of left lower extremity: I83.92

## 2023-10-25 HISTORY — DX: Intraductal carcinoma in situ of unspecified breast: D05.10

## 2023-10-25 HISTORY — DX: Depression, unspecified: F32.A

## 2023-10-25 HISTORY — DX: Pure hypercholesterolemia, unspecified: E78.00

## 2023-10-25 HISTORY — DX: Other specified abnormal findings of blood chemistry: R79.89

## 2023-10-25 SURGERY — COLONOSCOPY WITH PROPOFOL
Anesthesia: General

## 2024-05-14 ENCOUNTER — Other Ambulatory Visit: Payer: Self-pay | Admitting: Internal Medicine

## 2024-05-14 DIAGNOSIS — Z1231 Encounter for screening mammogram for malignant neoplasm of breast: Secondary | ICD-10-CM

## 2024-06-11 ENCOUNTER — Ambulatory Visit
Admission: RE | Admit: 2024-06-11 | Discharge: 2024-06-11 | Disposition: A | Discharge status: Home / Self Care | Source: Ambulatory Visit | Attending: Internal Medicine | Admitting: Internal Medicine

## 2024-06-11 DIAGNOSIS — Z1231 Encounter for screening mammogram for malignant neoplasm of breast: Secondary | ICD-10-CM | POA: Diagnosis not present

## 2024-06-20 DIAGNOSIS — J018 Other acute sinusitis: Secondary | ICD-10-CM | POA: Diagnosis not present

## 2024-06-20 DIAGNOSIS — R413 Other amnesia: Secondary | ICD-10-CM | POA: Diagnosis not present

## 2024-06-20 DIAGNOSIS — F331 Major depressive disorder, recurrent, moderate: Secondary | ICD-10-CM | POA: Diagnosis not present

## 2024-06-20 DIAGNOSIS — I10 Essential (primary) hypertension: Secondary | ICD-10-CM | POA: Diagnosis not present

## 2024-06-20 DIAGNOSIS — H9193 Unspecified hearing loss, bilateral: Secondary | ICD-10-CM | POA: Diagnosis not present

## 2024-06-20 DIAGNOSIS — D0511 Intraductal carcinoma in situ of right breast: Secondary | ICD-10-CM | POA: Diagnosis not present

## 2024-06-20 DIAGNOSIS — Z720 Tobacco use: Secondary | ICD-10-CM | POA: Diagnosis not present

## 2024-06-24 DIAGNOSIS — F32A Depression, unspecified: Secondary | ICD-10-CM | POA: Diagnosis not present

## 2024-06-24 DIAGNOSIS — Z1331 Encounter for screening for depression: Secondary | ICD-10-CM | POA: Diagnosis not present

## 2024-06-24 DIAGNOSIS — H9193 Unspecified hearing loss, bilateral: Secondary | ICD-10-CM | POA: Diagnosis not present

## 2024-06-24 DIAGNOSIS — F0150 Vascular dementia without behavioral disturbance: Secondary | ICD-10-CM | POA: Diagnosis not present

## 2024-06-24 DIAGNOSIS — G309 Alzheimer's disease, unspecified: Secondary | ICD-10-CM | POA: Diagnosis not present

## 2024-06-24 DIAGNOSIS — F0280 Dementia in other diseases classified elsewhere without behavioral disturbance: Secondary | ICD-10-CM | POA: Diagnosis not present

## 2024-06-24 DIAGNOSIS — F419 Anxiety disorder, unspecified: Secondary | ICD-10-CM | POA: Diagnosis not present

## 2024-06-27 DIAGNOSIS — Z0000 Encounter for general adult medical examination without abnormal findings: Secondary | ICD-10-CM | POA: Diagnosis not present

## 2024-06-27 DIAGNOSIS — Z1211 Encounter for screening for malignant neoplasm of colon: Secondary | ICD-10-CM | POA: Diagnosis not present

## 2024-06-27 DIAGNOSIS — F17210 Nicotine dependence, cigarettes, uncomplicated: Secondary | ICD-10-CM | POA: Diagnosis not present

## 2024-06-27 DIAGNOSIS — R195 Other fecal abnormalities: Secondary | ICD-10-CM | POA: Diagnosis not present

## 2024-06-27 DIAGNOSIS — Z86000 Personal history of in-situ neoplasm of breast: Secondary | ICD-10-CM | POA: Diagnosis not present

## 2024-06-27 DIAGNOSIS — I83892 Varicose veins of left lower extremities with other complications: Secondary | ICD-10-CM | POA: Diagnosis not present

## 2024-06-27 DIAGNOSIS — H9193 Unspecified hearing loss, bilateral: Secondary | ICD-10-CM | POA: Diagnosis not present

## 2024-06-27 DIAGNOSIS — Z1331 Encounter for screening for depression: Secondary | ICD-10-CM | POA: Diagnosis not present

## 2024-06-27 DIAGNOSIS — M1039 Gout due to renal impairment, multiple sites: Secondary | ICD-10-CM | POA: Diagnosis not present

## 2024-06-28 ENCOUNTER — Other Ambulatory Visit: Payer: Self-pay | Admitting: Neurology

## 2024-06-28 DIAGNOSIS — F0150 Vascular dementia without behavioral disturbance: Secondary | ICD-10-CM

## 2024-07-03 ENCOUNTER — Ambulatory Visit
Admission: RE | Admit: 2024-07-03 | Discharge: 2024-07-03 | Disposition: A | Discharge status: Home / Self Care | Source: Ambulatory Visit | Attending: Neurology | Admitting: Neurology

## 2024-07-03 DIAGNOSIS — F0280 Dementia in other diseases classified elsewhere without behavioral disturbance: Secondary | ICD-10-CM | POA: Insufficient documentation

## 2024-07-03 DIAGNOSIS — F0150 Vascular dementia without behavioral disturbance: Secondary | ICD-10-CM | POA: Insufficient documentation

## 2024-07-03 DIAGNOSIS — G309 Alzheimer's disease, unspecified: Secondary | ICD-10-CM | POA: Diagnosis not present

## 2024-07-03 DIAGNOSIS — R9089 Other abnormal findings on diagnostic imaging of central nervous system: Secondary | ICD-10-CM | POA: Diagnosis not present

## 2024-07-03 DIAGNOSIS — R9082 White matter disease, unspecified: Secondary | ICD-10-CM | POA: Diagnosis not present

## 2024-07-25 DIAGNOSIS — R195 Other fecal abnormalities: Secondary | ICD-10-CM | POA: Diagnosis not present

## 2024-07-25 DIAGNOSIS — K5900 Constipation, unspecified: Secondary | ICD-10-CM | POA: Diagnosis not present

## 2024-07-25 DIAGNOSIS — R634 Abnormal weight loss: Secondary | ICD-10-CM | POA: Diagnosis not present

## 2024-07-31 NOTE — Anesthesia Preprocedure Evaluation (Signed)
 Anesthesia Evaluation  Patient identified by MRN, date of birth, ID band Patient awake    Reviewed: Allergy & Precautions, H&P , NPO status , Patient's Chart, lab work & pertinent test results  Airway Mallampati: II  TM Distance: >3 FB Neck ROM: full    Dental no notable dental hx.    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) 2010: Arthritis 2021: Breast cancer (HCC) 1977: Cancer (HCC)     Comment:  cervical No date: Degenerative disc disease, lumbar No date: Depression No date: Ductal carcinoma in situ (DCIS) of breast     Comment:  RIGHT No date: Heart murmur No date: Hypercholesterolemia 2014: Hypertension No date: Low serum vitamin D No date: Memory problem No date: Personal history of gout No date: Personal history of radiation therapy No date: Varicose veins of left lower extremity  Past Surgical History: 1977: ABDOMINAL HYSTERECTOMY No date: APPENDECTOMY 2014: BREAST BIOPSY; Right     Comment:  stereo, neg 01/22/2020: BREAST BIOPSY; Right     Comment:  2 area bx coil and ribbon both high grade DCIS 02/14/2020: BREAST LUMPECTOMY; Right     Comment:  Wire/tag placement  2002: COLONOSCOPY 12/14/2017: COLONOSCOPY WITH PROPOFOL ; N/A     Comment:  Procedure: COLONOSCOPY WITH PROPOFOL ;  Surgeon:               Gaylyn Gladis PENNER, MD;  Location: [facility] ENDOSCOPY;                Service: Endoscopy;  Laterality:  N/A; 12/15/2017: COLONOSCOPY WITH PROPOFOL ; N/A     Comment:  Procedure: COLONOSCOPY WITH PROPOFOL ;  Surgeon:               Gaylyn Gladis PENNER, MD;  Location: [facility] ENDOSCOPY;                Service: Endoscopy;  Laterality: N/A; No date: HEMORRHOIDECTOMY WITH HEMORRHOID BANDING No date: HERNIA REPAIR 02/14/2020: LESION REMOVAL; Left     Comment:  Procedure: LESION REMOVAL;  Surgeon: Rodolph Romano, MD;  Location: [facility] ORS;  Service: General;                Laterality: Left; 02/14/2020: PARTIAL MASTECTOMY WITH NEEDLE LOCALIZATION AND AXILLARY  SENTINEL LYMPH NODE BX; Right     Comment:  Procedure: PARTIAL MASTECTOMY WITH NEEDLE LOCALIZATION               AND AXILLARY SENTINEL LYMPH NODE BX;  Surgeon:               Rodolph Romano, MD;  Location: [facility] ORS;  Service:              General;  Laterality: Right;     Reproductive/Obstetrics negative OB ROS  Anesthesia Physical Anesthesia Plan  ASA:   Anesthesia Plan: General   Post-op Pain Management:    Induction: Intravenous  PONV Risk Score and Plan: Propofol  infusion and TIVA  Airway Management Planned:   Additional Equipment:   Intra-op Plan:   Post-operative Plan:   Informed Consent:      Dental Advisory Given  Plan Discussed with: CRNA and Surgeon  Anesthesia Plan Comments:          Anesthesia Quick Evaluation

## 2024-08-01 ENCOUNTER — Ambulatory Visit: Payer: Self-pay | Admitting: Anesthesiology

## 2024-08-01 ENCOUNTER — Ambulatory Visit
Admission: RE | Admit: 2024-08-01 | Discharge: 2024-08-01 | Disposition: A | Discharge status: Home / Self Care | Attending: Gastroenterology | Admitting: Gastroenterology

## 2024-08-01 ENCOUNTER — Other Ambulatory Visit: Payer: Self-pay

## 2024-08-01 ENCOUNTER — Encounter: Payer: Self-pay | Admitting: Gastroenterology

## 2024-08-01 ENCOUNTER — Encounter: Payer: Self-pay | Admitting: Anesthesiology

## 2024-08-01 ENCOUNTER — Encounter
Admission: RE | Disposition: A | Discharge status: Home / Self Care | Payer: Self-pay | Source: Home / Self Care | Attending: Gastroenterology

## 2024-08-01 DIAGNOSIS — K635 Polyp of colon: Secondary | ICD-10-CM | POA: Insufficient documentation

## 2024-08-01 DIAGNOSIS — F17200 Nicotine dependence, unspecified, uncomplicated: Secondary | ICD-10-CM | POA: Insufficient documentation

## 2024-08-01 DIAGNOSIS — K5730 Diverticulosis of large intestine without perforation or abscess without bleeding: Secondary | ICD-10-CM | POA: Diagnosis not present

## 2024-08-01 DIAGNOSIS — R195 Other fecal abnormalities: Secondary | ICD-10-CM | POA: Diagnosis not present

## 2024-08-01 DIAGNOSIS — R413 Other amnesia: Secondary | ICD-10-CM | POA: Diagnosis not present

## 2024-08-01 DIAGNOSIS — Z800 Family history of malignant neoplasm of digestive organs: Secondary | ICD-10-CM | POA: Diagnosis not present

## 2024-08-01 DIAGNOSIS — D125 Benign neoplasm of sigmoid colon: Secondary | ICD-10-CM | POA: Diagnosis not present

## 2024-08-01 DIAGNOSIS — K621 Rectal polyp: Secondary | ICD-10-CM | POA: Diagnosis not present

## 2024-08-01 DIAGNOSIS — Z90710 Acquired absence of both cervix and uterus: Secondary | ICD-10-CM | POA: Diagnosis not present

## 2024-08-01 DIAGNOSIS — Z1211 Encounter for screening for malignant neoplasm of colon: Secondary | ICD-10-CM | POA: Diagnosis not present

## 2024-08-01 DIAGNOSIS — K624 Stenosis of anus and rectum: Secondary | ICD-10-CM | POA: Diagnosis not present

## 2024-08-01 DIAGNOSIS — I10 Essential (primary) hypertension: Secondary | ICD-10-CM | POA: Diagnosis not present

## 2024-08-01 DIAGNOSIS — K6289 Other specified diseases of anus and rectum: Secondary | ICD-10-CM | POA: Diagnosis not present

## 2024-08-01 DIAGNOSIS — D124 Benign neoplasm of descending colon: Secondary | ICD-10-CM | POA: Insufficient documentation

## 2024-08-01 HISTORY — PX: POLYPECTOMY: SHX149

## 2024-08-01 HISTORY — PX: COLONOSCOPY: SHX5424

## 2024-08-01 SURGERY — COLONOSCOPY
Anesthesia: General

## 2024-08-01 MED ORDER — LIDOCAINE HCL (CARDIAC) PF 100 MG/5ML IV SOSY
PREFILLED_SYRINGE | INTRAVENOUS | Status: DC | PRN
  Administered 2024-08-01: 50 mg via INTRAVENOUS

## 2024-08-01 MED ORDER — LIDOCAINE HCL (PF) 2 % IJ SOLN
INTRAMUSCULAR | Status: AC
  Filled 2024-08-01: qty 20

## 2024-08-01 MED ORDER — PROPOFOL 10 MG/ML IV BOLUS
INTRAVENOUS | Status: DC | PRN
  Administered 2024-08-01: 60 mg via INTRAVENOUS

## 2024-08-01 MED ORDER — SODIUM CHLORIDE 0.9 % IV SOLN: INTRAVENOUS | Status: DC

## 2024-08-01 MED ORDER — PROPOFOL 500 MG/50ML IV EMUL
INTRAVENOUS | Status: DC | PRN
  Administered 2024-08-01: 150 ug/kg/min via INTRAVENOUS

## 2024-08-01 MED ORDER — PHENYLEPHRINE 80 MCG/ML (10ML) SYRINGE FOR IV PUSH (FOR BLOOD PRESSURE SUPPORT)
PREFILLED_SYRINGE | INTRAVENOUS | Status: AC
  Filled 2024-08-01: qty 20

## 2024-08-01 MED ORDER — PROPOFOL 1000 MG/100ML IV EMUL
INTRAVENOUS | Status: AC
  Filled 2024-08-01: qty 200

## 2024-08-01 MED ORDER — DEXMEDETOMIDINE HCL IN NACL 80 MCG/20ML IV SOLN
INTRAVENOUS | Status: AC
  Filled 2024-08-01: qty 20

## 2024-08-01 NOTE — Interval H&P Note (Signed)
 History and Physical Interval Note: Preprocedure H&P from 08/01/24  was reviewed and there was no interval change after seeing and examining the patient.  Written consent was obtained from the patient after discussion of risks, benefits, and alternatives. Patient has consented to proceed with Colonoscopy with possible intervention   08/01/2024 10:34 AM  Deanna Grant  has presented today for surgery, with the diagnosis of R19.5 (ICD-10-CM) - Positive colorectal cancer screening using Cologuard test.  The various methods of treatment have been discussed with the patient and family. After consideration of risks, benefits and other options for treatment, the patient has consented to  Procedure(s): COLONOSCOPY (N/A) as a surgical intervention.  The patient's history has been reviewed, patient examined, no change in status, stable for surgery.  I have reviewed the patient's chart and labs.  Questions were answered to the patient's satisfaction.     Elspeth Ozell Jungling

## 2024-08-01 NOTE — H&P (Signed)
 Pre-Procedure H&P   Patient ID: Deanna Grant is a 78 y.o. female.  Gastroenterology Provider: Elspeth Ozell Jungling, DO  Referring Provider: Romero [place], PA PCP: Sadie Manna, MD  Date: 08/01/2024  HPI Deanna Grant is a 78 y.o. female who presents today for Colonoscopy for positive cologuard .  Patient with a positive Cologuard in April 2024.  She reports occasional constipation with a bowel movement every 2 to 3 days.  She has noted bright red blood per rectum.  No melena or hematochezia.  Her weight has decreased over the last few weeks.  Last underwent colonoscopy in 2019 with an adenomatous polyp and sigmoid diverticulosis.  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  Brother with pancreatic cancer.  Status post hysterectomy  Creatinine 1.0 hemoglobin 14 MCV 102 platelets 247,000   Past Medical History:  Diagnosis Date   Angina at rest [facility])    Arthritis 2010   Breast cancer (HCC) 2021   Cancer (HCC) 1977   cervical   Degenerative disc disease, lumbar    Depression    Ductal carcinoma in situ (DCIS) of breast    RIGHT   Heart murmur    Hypercholesterolemia    Hypertension 2014   Low serum vitamin D    Memory problem    Personal history of gout    Personal history of radiation therapy    Varicose veins of left lower extremity     Past Surgical History:  Procedure Laterality Date   ABDOMINAL HYSTERECTOMY  1977   APPENDECTOMY     BREAST BIOPSY Right 2014   stereo, neg   BREAST BIOPSY Right 01/22/2020   2 area bx coil and ribbon both high grade DCIS   BREAST LUMPECTOMY Right 02/14/2020   Wire/tag placement    COLONOSCOPY  2002   COLONOSCOPY WITH PROPOFOL  N/A 12/14/2017   Procedure: COLONOSCOPY WITH PROPOFOL ;  Surgeon: Gaylyn Gladis PENNER, MD;  Location: [facility] ENDOSCOPY;  Service: Endoscopy;  Laterality: N/A;   COLONOSCOPY WITH PROPOFOL  N/A 12/15/2017    Procedure: COLONOSCOPY WITH PROPOFOL ;  Surgeon: Gaylyn Gladis PENNER, MD;  Location: [facility] ENDOSCOPY;  Service: Endoscopy;  Laterality: N/A;   HEMORRHOIDECTOMY WITH HEMORRHOID BANDING     HERNIA REPAIR     LESION REMOVAL Left 02/14/2020   Procedure: LESION REMOVAL;  Surgeon: Rodolph Romano, MD;  Location: [facility] ORS;  Service: General;  Laterality: Left;   PARTIAL MASTECTOMY WITH NEEDLE LOCALIZATION AND AXILLARY SENTINEL LYMPH NODE BX Right 02/14/2020   Procedure: PARTIAL MASTECTOMY WITH NEEDLE LOCALIZATION AND AXILLARY SENTINEL LYMPH NODE BX;  Surgeon: Rodolph Romano, MD;  Location: [facility] ORS;  Service: General;  Laterality: Right;    Family History Father- crc 39s; brother panc ca No other h/o GI disease or malignancy  Review of Systems  Constitutional:  Negative for activity change, appetite change, chills, diaphoresis, fatigue, fever and unexpected weight change.  HENT:  Negative for trouble swallowing and voice change.   Respiratory:  Negative for shortness of breath and wheezing.   Cardiovascular:  Negative for chest pain, palpitations and leg swelling.  Gastrointestinal:  Negative for abdominal distention, abdominal pain, anal bleeding, blood in stool, constipation, diarrhea, nausea, rectal pain and vomiting.  Musculoskeletal:  Negative for arthralgias and myalgias.  Skin:  Negative for color change and pallor.  Neurological:  Negative for dizziness, syncope and weakness.  Psychiatric/Behavioral:  Negative for confusion.   All other  systems reviewed and are negative.    Medications No current facility-administered medications on file prior to encounter.   Current Outpatient Medications on File Prior to Encounter  Medication Sig Dispense Refill   allopurinol (ZYLOPRIM) 100 MG tablet Take 100 mg by mouth at bedtime.     ALPRAZolam (XANAX) 0.25 MG tablet Take 0.25 mg by mouth daily as needed.     amLODipine (NORVASC) 2.5 MG tablet Take 2.5 mg by mouth at bedtime.      aspirin 81 MG chewable tablet Chew 81 mg by mouth daily.     cholecalciferol (VITAMIN D) 25 MCG (1000 UNIT) tablet Take 1,000 Units by mouth daily.      Cyanocobalamin (B-12) 2500 MCG TABS Take 2,500 mcg by mouth daily.     olmesartan (BENICAR) 40 MG tablet Take 40 mg by mouth daily.     omeprazole (PRILOSEC) 20 MG capsule Take 20 mg by mouth daily.     acetaminophen  (TYLENOL ) 500 MG tablet Take 1,000 mg by mouth every 6 (six) hours as needed for moderate pain.     cyclobenzaprine  (FLEXERIL ) 5 MG tablet Take 1 tablet (5 mg total) by mouth 3 (three) times daily as needed for muscle spasms. 15 tablet 0   silver  sulfADIAZINE  (SILVADENE ) 1 % cream Apply 1 application topically 2 (two) times daily. 50 g 2   simvastatin (ZOCOR) 20 MG tablet Take 20 mg by mouth at bedtime.      thiamine (VITAMIN B-1) 100 MG tablet Take 100 mg by mouth daily.      Pertinent medications related to GI and procedure were reviewed by me with the patient prior to the procedure   Current Facility-Administered Medications:    0.9 %  sodium chloride  infusion, , Intravenous, Continuous, Onita Elspeth Sharper, DO, Last Rate: 20 mL/hr at 08/01/24 1030, New Bag at 08/01/24 1030  sodium chloride  20 mL/hr at 08/01/24 1030       Allergies  Allergen Reactions   Asa [Aspirin] Nausea Only   Penicillins Hives and Nausea Only    Did it involve swelling of the face/tongue/throat, SOB, or low BP? No Did it involve sudden or severe rash/hives, skin peeling, or any reaction on the inside of your mouth or nose? No Did you need to seek medical attention at a hospital or doctor's office? No When did it last happen?     childhood allergy  If all above answers are "NO", may proceed with cephalosporin use.    Allergies were reviewed by me prior to the procedure  Objective   Body mass index is 28.48 kg/m. Vitals:   08/01/24 1027  BP: (!) 146/82  Pulse: 64  Resp: 20  Temp: (!) 95.6 F (35.3 C)  TempSrc: Temporal  SpO2: 100%   Weight: 59.7 kg  Height: 4' 9 (1.448 m)     Physical Exam Vitals and nursing note reviewed.  Constitutional:      General: She is not in acute distress.    Appearance: Normal appearance. She is not ill-appearing, toxic-appearing or diaphoretic.  HENT:     Head: Normocephalic and atraumatic.     Nose: Nose normal.     Mouth/Throat:     Mouth: Mucous membranes are moist.     Pharynx: Oropharynx is clear.  Eyes:     General: No scleral icterus.    Extraocular Movements: Extraocular movements intact.  Cardiovascular:     Rate and Rhythm: Normal rate and regular rhythm.     Heart sounds: Normal heart  sounds. No murmur heard.    No friction rub. No gallop.  Pulmonary:     Effort: Pulmonary effort is normal. No respiratory distress.     Breath sounds: Normal breath sounds. No wheezing, rhonchi or rales.  Abdominal:     General: Bowel sounds are normal. There is no distension.     Palpations: Abdomen is soft.     Tenderness: There is no abdominal tenderness. There is no guarding or rebound.  Musculoskeletal:     Cervical back: Neck supple.     Right lower leg: No edema.     Left lower leg: No edema.  Skin:    General: Skin is warm and dry.     Coloration: Skin is not jaundiced or pale.  Neurological:     Mental Status: She is alert.      Assessment:  Ms. KYIA RHUDE is a 78 y.o. female  who presents today for Colonoscopy for positive cologuard .  Plan:  Colonoscopy with possible intervention today  Colonoscopy with possible biopsy, control of bleeding, polypectomy, and interventions as necessary has been discussed with the patient/patient representative. Informed consent was obtained from the patient/patient representative after explaining the indication, nature, and risks of the procedure including but not limited to death, bleeding, perforation, missed neoplasm/lesions, cardiorespiratory compromise, and reaction to medications. Opportunity for questions was given and  appropriate answers were provided. Patient/patient representative has verbalized understanding is amenable to undergoing the procedure.   Elspeth Ozell Jungling, DO  [facility]  Portions of the record may have been created with voice recognition software. Occasional wrong-word or 'sound-a-like' substitutions may have occurred due to the inherent limitations of voice recognition software.  Read the chart carefully and recognize, using context, where substitutions may have occurred.

## 2024-08-01 NOTE — Transfer of Care (Signed)
 Immediate Anesthesia Transfer of Care Note  Patient: Deanna Grant  Procedure(s) Performed: COLONOSCOPY POLYPECTOMY, INTESTINE  Patient Location: PACU and Endoscopy Unit  Anesthesia Type:General  Level of Consciousness: drowsy and patient cooperative  Airway & Oxygen Therapy: Patient Spontanous Breathing  Post-op Assessment: Report given to RN, Post -op Vital signs reviewed and stable, and Patient moving all extremities X 4  Post vital signs: Reviewed and stable  Last Vitals:  Vitals Value Taken Time  BP 87/50 08/01/24 11:18  Temp 35.1 C 08/01/24 11:17  Pulse 61 08/01/24 11:18  Resp 16 08/01/24 11:18  SpO2 98 % 08/01/24 11:18  Vitals shown include unfiled device data.  Last Pain:  Vitals:   08/01/24 1117  TempSrc:   PainSc: Asleep         Complications: No notable events documented.

## 2024-08-01 NOTE — Op Note (Signed)
 [facility] Gastroenterology Patient Name: Deanna Grant Procedure Date: 08/01/2024 10:02 AM MRN: [id number] Account #: [id number] Date of Birth: Apr 20, 1946 Admit Type: Outpatient Age: 78 Room: [facility] ENDO ROOM 1 Gender: Female Note Status: Finalized Instrument Name: Peds Colonoscope [id number] Procedure:             Colonoscopy Indications:           Positive Cologuard test Providers:             Elspeth Ozell Jungling DO, DO Referring MD:          Tamra Leventhal, MD (Referring MD) Medicines:             Monitored Anesthesia Care Complications:         No immediate complications. Estimated blood loss:                         Minimal. Procedure:             Pre-Anesthesia Assessment:                        - Prior to the procedure, a History and Physical was                         performed, and patient medications and allergies were                         reviewed. The patient is competent. The risks and                         benefits of the procedure and the sedation options and                         risks were discussed with the patient. All questions                         were answered and informed consent was obtained.                         Patient identification and proposed procedure were                         verified by the physician, the nurse, the anesthetist                         and the technician in the endoscopy suite. Mental                         Status Examination: alert and oriented. Airway                         Examination: normal oropharyngeal airway and neck                         mobility. Respiratory Examination: clear to                         auscultation. CV Examination: RRR, no murmurs, no S3  or S4. Prophylactic Antibiotics: The patient does not                         require prophylactic antibiotics. Prior                         Anticoagulants: The patient has taken no anticoagulant                          or antiplatelet agents. ASA Grade Assessment: III - A                         patient with severe systemic disease. After reviewing                         the risks and benefits, the patient was deemed in                         satisfactory condition to undergo the procedure. The                         anesthesia plan was to use monitored anesthesia care                         (MAC). Immediately prior to administration of                         medications, the patient was re-assessed for adequacy                         to receive sedatives. The heart rate, respiratory                         rate, oxygen saturations, blood pressure, adequacy of                         pulmonary ventilation, and response to care were                         monitored throughout the procedure. The physical                         status of the patient was re-assessed after the                         procedure.                        After obtaining informed consent, the colonoscope was                         passed under direct vision. Throughout the procedure,                         the patient's blood pressure, pulse, and oxygen                         saturations were monitored continuously. The  Colonoscope was introduced through the anus and                         advanced to the the terminal ileum, with                         identification of the appendiceal orifice and IC                         valve. The colonoscopy was performed with moderate                         difficulty due to multiple diverticula in the colon                         and restricted mobility of the colon. Successful                         completion of the procedure was aided by changing the                         patient to a supine position, changing the patient to                         a prone position, applying abdominal pressure and                         lavage. The  patient tolerated the procedure well. The                         quality of the bowel preparation was evaluated using                         the BBPS [facility] Bowel Preparation Scale) with scores                         of: Right Colon = 3, Transverse Colon = 3 and Left                         Colon = 3 (entire mucosa seen well with no residual                         staining, small fragments of stool or opaque liquid).                         The total BBPS score equals 9. The terminal ileum,                         ileocecal valve, appendiceal orifice, and rectum were                         photographed. Findings:      The perianal and digital rectal examinations were normal. Pertinent       negatives include normal sphincter tone.      The terminal ileum appeared normal. Estimated blood loss: none.      Retroflexion in the right colon was performed.  Multiple small-mouthed diverticula were found in the entire colon.       Severe in the left colon, specifically the sigmoid. Estimated blood       loss: none.      Two sessile polyps were found in the transverse colon. The polyps were 1       to 2 mm in size. These polyps were removed with a cold biopsy forceps.       Resection and retrieval were complete. Estimated blood loss was minimal.      Two sessile polyps were found in the sigmoid colon and descending colon.       The polyps were 3 to 5 mm in size. These polyps were removed with a cold       snare. Resection and retrieval were complete. Estimated blood loss was       minimal.      A localized area of moderately granular mucosa was found in the rectum.       flat appearing area measuring approximately 11-12 mm. Edges slightly       raised and vascular. Previously documented rectal ulcer in 2019 with       colitis findings without adenomatous features. Edges of area today were       biopsied to rule out adenomatous findings versus healed ulceration.       Biopsies were taken  with a cold forceps for histology. Estimated blood       loss was minimal.      A benign-appearing, intrinsic moderate stenosis measuring 1.2 cm (inner       diameter) was found at 21 cm proximal to the anus and was traversed.       Estimated blood loss: none.      The exam was otherwise without abnormality on direct and retroflexion       views. Impression:            - The examined portion of the ileum was normal.                        - Diverticulosis in the entire examined colon.                        - Two 1 to 2 mm polyps in the transverse colon,                         removed with a cold biopsy forceps. Resected and                         retrieved.                        - Two 3 to 5 mm polyps in the sigmoid colon and in the                         descending colon, removed with a cold snare. Resected                         and retrieved.                        - Granular mucosa in the rectum. Biopsied.                        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                        - The examination was otherwise normal on direct and                         retroflexion views. Recommendation:        - Patient has a contact number available for                         emergencies. The signs and symptoms of potential                         delayed complications were discussed with the patient.                         Return to normal activities tomorrow. Written                         discharge instructions were provided to the patient.                        - Discharge patient to home.                        - Resume previous diet.                        - Continue present medications.                        - No ibuprofen, naproxen, or other non-steroidal                         anti-inflammatory drugs for 5 days after polyp removal.                        - Await pathology results.                        - Repeat colonoscopy for surveillance based on                          pathology results.                        - If rectal biopsies return with adenmatous/polyp                         findings, will repeat flex sig in short term to remove.                        - Return to referring physician as previously                         scheduled.                        - No stool card testing for malignancy/polyps given  personal and family history of polyps and colon cancer                         respectively.                        - The findings and recommendations were discussed with                         the patient.                        - The findings and recommendations were discussed with                         the patient's family. Procedure Code(s):     --- Professional ---                        [phone], Colonoscopy, flexible; with removal of                         tumor(s), polyp(s), or other lesion(s) by snare                         technique                        45380, 59, Colonoscopy, flexible; with biopsy, single                         or multiple Diagnosis Code(s):     --- Professional ---                        D12.3, Benign neoplasm of transverse colon (hepatic                         flexure or splenic flexure)                        D12.5, Benign neoplasm of sigmoid colon                        D12.4, Benign neoplasm of descending colon                        K62.89, Other specified diseases of anus and rectum                        K56.699, Other intestinal obstruction unspecified as                         to partial versus complete obstruction                        R19.5, Other fecal abnormalities                        K57.30, Diverticulosis of large intestine without                         perforation or abscess without bleeding CPT copyright  2022 [employer]. All rights reserved. The codes documented in this report are preliminary and upon coder review may  be  revised to meet current compliance requirements. Attending Participation:      I personally performed the entire procedure. Elspeth Jungling, DO Elspeth Ozell Jungling DO, DO 08/01/2024 11:35:32 AM This report has been signed electronically. Number of Addenda: 0 Note Initiated On: 08/01/2024 10:02 AM Scope Withdrawal Time: 0 hours 16 minutes 59 seconds  Total Procedure Duration: 0 hours 34 minutes 32 seconds  Estimated Blood Loss:  Estimated blood loss was minimal.      [facility]

## 2024-08-02 LAB — SURGICAL PATHOLOGY

## 2024-08-02 NOTE — Anesthesia Postprocedure Evaluation (Signed)
 Anesthesia Post Note  Patient: Deanna Grant  Procedure(s) Performed: COLONOSCOPY POLYPECTOMY, INTESTINE  Patient location during evaluation: PACU Anesthesia Type: General Level of consciousness: awake and alert Pain management: pain level controlled Vital Signs Assessment: post-procedure vital signs reviewed and stable Respiratory status: spontaneous breathing, nonlabored ventilation and respiratory function stable Cardiovascular status: blood pressure returned to baseline and stable Postop Assessment: no apparent nausea or vomiting Anesthetic complications: no   No notable events documented.   Last Vitals:  Vitals:   08/01/24 1142 08/01/24 1148  BP: 110/64 131/74  Pulse: (!) 54 (!) 56  Resp: 15 18  Temp:    SpO2: 99% 98%    Last Pain:  Vitals:   08/01/24 1142  TempSrc:   PainSc: 0-No pain                 Camellia Merilee Louder

## 2025-01-03 ENCOUNTER — Other Ambulatory Visit: Payer: Self-pay | Admitting: Internal Medicine

## 2025-01-03 DIAGNOSIS — F17210 Nicotine dependence, cigarettes, uncomplicated: Secondary | ICD-10-CM

## 2025-01-03 DIAGNOSIS — I10 Essential (primary) hypertension: Secondary | ICD-10-CM

## 2025-01-22 ENCOUNTER — Ambulatory Visit
